# Patient Record
Sex: Female | Born: 1965 | Race: White | Hispanic: No | Marital: Married | Smoking: Never smoker
Health system: Southern US, Community
[De-identification: ages and names within clinical notes are randomized; demographics above are authoritative.]

## PROBLEM LIST (undated history)

## (undated) DIAGNOSIS — D72819 Decreased white blood cell count, unspecified: Secondary | ICD-10-CM

## (undated) DIAGNOSIS — I10 Essential (primary) hypertension: Secondary | ICD-10-CM

## (undated) DIAGNOSIS — Z8673 Personal history of transient ischemic attack (TIA), and cerebral infarction without residual deficits: Secondary | ICD-10-CM

## (undated) DIAGNOSIS — J852 Abscess of lung without pneumonia: Secondary | ICD-10-CM

## (undated) DIAGNOSIS — K5790 Diverticulosis of intestine, part unspecified, without perforation or abscess without bleeding: Secondary | ICD-10-CM

## (undated) DIAGNOSIS — M069 Rheumatoid arthritis, unspecified: Secondary | ICD-10-CM

## (undated) DIAGNOSIS — F419 Anxiety disorder, unspecified: Secondary | ICD-10-CM

## (undated) DIAGNOSIS — K056 Periodontal disease, unspecified: Secondary | ICD-10-CM

## (undated) DIAGNOSIS — C539 Malignant neoplasm of cervix uteri, unspecified: Secondary | ICD-10-CM

## (undated) HISTORY — PX: PARATHYROIDECTOMY: SHX19

## (undated) HISTORY — DX: Malignant neoplasm of cervix uteri, unspecified: C53.9

## (undated) HISTORY — DX: Diverticulosis of intestine, part unspecified, without perforation or abscess without bleeding: K57.90

## (undated) HISTORY — DX: Decreased white blood cell count, unspecified: D72.819

## (undated) HISTORY — DX: Essential (primary) hypertension: I10

## (undated) HISTORY — PX: TOTAL ABDOMINAL HYSTERECTOMY: SHX209

## (undated) HISTORY — PX: HAND SURGERY: SHX662

## (undated) HISTORY — DX: Rheumatoid arthritis, unspecified: M06.9

## (undated) HISTORY — DX: Anxiety disorder, unspecified: F41.9

## (undated) HISTORY — DX: Periodontal disease, unspecified: K05.6

## (undated) HISTORY — PX: ABDOMINAL HYSTERECTOMY: SHX81

## (undated) HISTORY — DX: Personal history of transient ischemic attack (TIA), and cerebral infarction without residual deficits: Z86.73

## (undated) HISTORY — DX: Abscess of lung without pneumonia: J85.2

## (undated) HISTORY — PX: CHOLECYSTECTOMY: SHX55

---

## 2007-12-21 ENCOUNTER — Encounter: Payer: Self-pay | Admitting: Emergency Medicine

## 2007-12-21 ENCOUNTER — Inpatient Hospital Stay (HOSPITAL_COMMUNITY): Admission: EM | Admit: 2007-12-21 | Discharge: 2007-12-23 | Payer: Self-pay | Admitting: Pediatrics

## 2007-12-22 ENCOUNTER — Ambulatory Visit: Payer: Self-pay | Admitting: Surgery

## 2007-12-22 ENCOUNTER — Encounter (INDEPENDENT_AMBULATORY_CARE_PROVIDER_SITE_OTHER): Payer: Self-pay | Admitting: Pediatrics

## 2007-12-22 ENCOUNTER — Encounter (INDEPENDENT_AMBULATORY_CARE_PROVIDER_SITE_OTHER): Payer: Self-pay | Admitting: Neurology

## 2007-12-23 ENCOUNTER — Encounter (INDEPENDENT_AMBULATORY_CARE_PROVIDER_SITE_OTHER): Payer: Self-pay | Admitting: Neurology

## 2008-08-20 ENCOUNTER — Emergency Department (HOSPITAL_COMMUNITY): Admission: EM | Admit: 2008-08-20 | Discharge: 2008-08-20 | Payer: Self-pay | Admitting: Family Medicine

## 2008-08-25 ENCOUNTER — Inpatient Hospital Stay (HOSPITAL_COMMUNITY): Admission: EM | Admit: 2008-08-25 | Discharge: 2008-08-28 | Payer: Self-pay | Admitting: Emergency Medicine

## 2008-08-25 ENCOUNTER — Ambulatory Visit: Payer: Self-pay | Admitting: Internal Medicine

## 2008-08-26 ENCOUNTER — Encounter (INDEPENDENT_AMBULATORY_CARE_PROVIDER_SITE_OTHER): Payer: Self-pay | Admitting: Internal Medicine

## 2008-08-26 ENCOUNTER — Ambulatory Visit: Payer: Self-pay | Admitting: Internal Medicine

## 2008-08-27 ENCOUNTER — Encounter: Payer: Self-pay | Admitting: Internal Medicine

## 2008-09-13 LAB — CBC WITH DIFFERENTIAL/PLATELET
BASO%: 1.1 % (ref 0.0–2.0)
EOS%: 4.5 % (ref 0.0–7.0)
HCT: 35.3 % (ref 34.8–46.6)
MCH: 30.3 pg (ref 26.0–34.0)
MCHC: 35.8 g/dL (ref 32.0–36.0)
MONO#: 0.8 10*3/uL (ref 0.1–0.9)
RBC: 4.17 10*6/uL (ref 3.70–5.32)
RDW: 15.2 % — ABNORMAL HIGH (ref 11.3–14.5)
WBC: 4.1 10*3/uL (ref 3.9–10.0)
lymph#: 2.5 10*3/uL (ref 0.9–3.3)

## 2008-09-13 LAB — LACTATE DEHYDROGENASE: LDH: 131 U/L (ref 94–250)

## 2008-09-24 ENCOUNTER — Ambulatory Visit: Payer: Self-pay | Admitting: Family Medicine

## 2008-09-29 ENCOUNTER — Ambulatory Visit: Payer: Self-pay | Admitting: *Deleted

## 2008-11-15 ENCOUNTER — Ambulatory Visit: Payer: Self-pay | Admitting: Internal Medicine

## 2008-11-15 ENCOUNTER — Encounter (INDEPENDENT_AMBULATORY_CARE_PROVIDER_SITE_OTHER): Payer: Self-pay | Admitting: Family Medicine

## 2008-11-15 LAB — CONVERTED CEMR LAB
ALT: 13 units/L (ref 0–35)
AST: 21 units/L (ref 0–37)
Albumin: 3.7 g/dL (ref 3.5–5.2)
Alkaline Phosphatase: 70 units/L (ref 39–117)
BUN: 12 mg/dL (ref 6–23)
Basophils Absolute: 0 10*3/uL (ref 0.0–0.1)
Basophils Relative: 1 % (ref 0–1)
CO2: 23 meq/L (ref 19–32)
Calcium: 8.6 mg/dL (ref 8.4–10.5)
Chloride: 98 meq/L (ref 96–112)
Creatinine, Ser: 0.97 mg/dL (ref 0.40–1.20)
Eosinophils Absolute: 0.2 10*3/uL (ref 0.0–0.7)
Eosinophils Relative: 8 % — ABNORMAL HIGH (ref 0–5)
Glucose, Bld: 97 mg/dL (ref 70–99)
HCT: 35.4 % — ABNORMAL LOW (ref 36.0–46.0)
Hemoglobin: 12.4 g/dL (ref 12.0–15.0)
Lymphocytes Relative: 59 % — ABNORMAL HIGH (ref 12–46)
Lymphs Abs: 1.4 10*3/uL (ref 0.7–4.0)
MCHC: 35 g/dL (ref 30.0–36.0)
MCV: 86.6 fL (ref 78.0–100.0)
Monocytes Absolute: 0.5 10*3/uL (ref 0.1–1.0)
Monocytes Relative: 22 % — ABNORMAL HIGH (ref 3–12)
Neutro Abs: 0.3 10*3/uL — ABNORMAL LOW (ref 1.7–7.7)
Neutrophils Relative %: 11 % — ABNORMAL LOW (ref 43–77)
Platelets: 118 10*3/uL — ABNORMAL LOW (ref 150–400)
Potassium: 3.4 meq/L — ABNORMAL LOW (ref 3.5–5.3)
RBC: 4.09 M/uL (ref 3.87–5.11)
RDW: 16 % — ABNORMAL HIGH (ref 11.5–15.5)
Sodium: 136 meq/L (ref 135–145)
Total Bilirubin: 0.3 mg/dL (ref 0.3–1.2)
Total Protein: 6.8 g/dL (ref 6.0–8.3)
WBC: 2.3 10*3/uL — ABNORMAL LOW (ref 4.0–10.5)

## 2009-01-31 ENCOUNTER — Inpatient Hospital Stay (HOSPITAL_COMMUNITY): Admission: EM | Admit: 2009-01-31 | Discharge: 2009-02-03 | Payer: Self-pay | Admitting: Emergency Medicine

## 2010-05-11 ENCOUNTER — Observation Stay (HOSPITAL_COMMUNITY): Admission: EM | Admit: 2010-05-11 | Discharge: 2010-05-11 | Payer: Self-pay | Admitting: Emergency Medicine

## 2010-05-11 ENCOUNTER — Encounter (INDEPENDENT_AMBULATORY_CARE_PROVIDER_SITE_OTHER): Payer: Self-pay | Admitting: Internal Medicine

## 2010-05-11 ENCOUNTER — Ambulatory Visit: Payer: Self-pay | Admitting: Cardiology

## 2010-05-30 ENCOUNTER — Encounter: Admission: RE | Admit: 2010-05-30 | Discharge: 2010-05-30 | Payer: Self-pay | Admitting: Gastroenterology

## 2010-06-16 ENCOUNTER — Encounter: Payer: Self-pay | Admitting: Critical Care Medicine

## 2010-06-20 ENCOUNTER — Encounter: Payer: Self-pay | Admitting: Critical Care Medicine

## 2010-06-21 DIAGNOSIS — F411 Generalized anxiety disorder: Secondary | ICD-10-CM | POA: Insufficient documentation

## 2010-06-21 DIAGNOSIS — M069 Rheumatoid arthritis, unspecified: Secondary | ICD-10-CM | POA: Insufficient documentation

## 2010-06-21 DIAGNOSIS — D72819 Decreased white blood cell count, unspecified: Secondary | ICD-10-CM | POA: Insufficient documentation

## 2010-06-21 DIAGNOSIS — I10 Essential (primary) hypertension: Secondary | ICD-10-CM | POA: Insufficient documentation

## 2010-06-22 ENCOUNTER — Encounter: Payer: Self-pay | Admitting: Critical Care Medicine

## 2010-06-22 ENCOUNTER — Ambulatory Visit: Payer: Self-pay | Admitting: Critical Care Medicine

## 2010-06-22 DIAGNOSIS — Z8679 Personal history of other diseases of the circulatory system: Secondary | ICD-10-CM | POA: Insufficient documentation

## 2010-06-22 DIAGNOSIS — J852 Abscess of lung without pneumonia: Secondary | ICD-10-CM | POA: Insufficient documentation

## 2010-06-23 ENCOUNTER — Telehealth: Payer: Self-pay | Admitting: Critical Care Medicine

## 2010-06-26 ENCOUNTER — Telehealth (INDEPENDENT_AMBULATORY_CARE_PROVIDER_SITE_OTHER): Payer: Self-pay | Admitting: *Deleted

## 2010-06-27 ENCOUNTER — Ambulatory Visit: Payer: Self-pay | Admitting: Critical Care Medicine

## 2010-06-27 ENCOUNTER — Ambulatory Visit: Admission: RE | Admit: 2010-06-27 | Discharge: 2010-06-27 | Payer: Self-pay | Admitting: Critical Care Medicine

## 2010-06-28 ENCOUNTER — Telehealth: Payer: Self-pay | Admitting: Critical Care Medicine

## 2010-07-03 ENCOUNTER — Telehealth: Payer: Self-pay | Admitting: Critical Care Medicine

## 2010-07-06 ENCOUNTER — Ambulatory Visit: Payer: Self-pay | Admitting: Critical Care Medicine

## 2010-07-06 ENCOUNTER — Ambulatory Visit (HOSPITAL_BASED_OUTPATIENT_CLINIC_OR_DEPARTMENT_OTHER)
Admission: RE | Admit: 2010-07-06 | Discharge: 2010-07-06 | Payer: Self-pay | Source: Home / Self Care | Admitting: Critical Care Medicine

## 2010-07-06 DIAGNOSIS — K0520 Aggressive periodontitis, unspecified: Secondary | ICD-10-CM

## 2010-07-06 DIAGNOSIS — K029 Dental caries, unspecified: Secondary | ICD-10-CM | POA: Insufficient documentation

## 2010-07-10 ENCOUNTER — Encounter: Payer: Self-pay | Admitting: Critical Care Medicine

## 2010-08-17 ENCOUNTER — Encounter: Payer: Self-pay | Admitting: Critical Care Medicine

## 2010-08-17 ENCOUNTER — Ambulatory Visit: Admit: 2010-08-17 | Payer: Self-pay | Admitting: Critical Care Medicine

## 2010-09-05 NOTE — Assessment & Plan Note (Signed)
Summary: Pulmonary OV   Copy to:  Zenovia Jordan, MD Primary Provider/Referring Provider:  Dr. Juluis Rainier, [facility]  CC:  2 wk follow up with CXR.  Breathing and cough have improved.  Prod cough with small amount of clear to yellow mucus..  History of Present Illness: Pulmonary Consultation   45 yo WF referred for evaluation of RUL cavitary lung lesion  This pt has a 3cm nodule LUL cavitarylesion on recent CXR  not present 10/11.  THis is now  present on 06/16/10 film.  The pt's symptoms include:  chest pain, wheezing, hoarse, diff time swallowing, high fevers 101-102,  The pt has had hectic fevers for 3months. The pt notes temp spike to 102.   There is mucus productive :  clear to green.  There has been 3 months of  cough sometimes is dry and productive. There is severe chest pain as well The pt Dx RA for 74yrs. Rx: mtx, arava, gold ., plaq, celebrex, embrel With Mtx : lfts up ,  ARAVA did not help.  The gold did not help The embrel given >1.56yr.  MTX rx: 31yrs.  RHeum MD was [facility] in [place].   FOr RA: Recently now no prednisone and IBup. The pt has been on  prednisone on and off for 18yrs. The pt is now on 51m/d pred now.  The pt was on 10mg /d for one month and now just bumped to 15/d This pt has been Dx with prior infections in chest , These episodes are  more freq  in past year.  There is min smoke exposure, no passive smoke The pt has been dx bronchitis.  There has not been  dx of pna in past The pt does wheeze on L side .  No TB exposure  The pt has hadrecent ABX: zpak. 10/11.  No abx in 3-4 weeks. July 06, 2010 12:04 PM This pt is improved. There is less cough and dyspnea.  There is no chest pain. There is no fever. Pt now relates she has had problems with severe dental caries and abscesses in gum. Pt underwent Fob.  No endobronch lesions seen   Current Medications (verified): 1)  Coreg 6.25 Mg Tabs (Carvedilol) .... Take 1 Tablet By Mouth Two Times A Day 2)   Prednisone 10 Mg Tabs (Prednisone) .... 15mg  Once Daily 3)  Klor-Con M10 10 Meq Cr-Tabs (Potassium Chloride Crys Cr) .... Take 1 Tablet By Mouth Three Times A Day 4)  Aldactone 25 Mg Tabs (Spironolactone) .... Take 1 Tablet By Mouth Once A Day 5)  Norvasc 5 Mg Tabs (Amlodipine Besylate) .... Take 1 Tablet By Mouth Once A Day 6)  Ambien 5 Mg Tabs (Zolpidem Tartrate) .... Take 1 Tab By Mouth At Bedtime 7)  Trazodone Hcl 50 Mg Tabs (Trazodone Hcl) .... Take 1 Tab By Mouth At Bedtime 8)  Xanax 1 Mg Tabs (Alprazolam) .... Take 1 Tab By Mouth At Bedtime 9)  Aspirin 325 Mg  Tabs (Aspirin) .... Take 1 Tablet By Mouth Once A Day 10)  Cleocin 300 Mg Caps (Clindamycin Hcl) .... Take 1 Capsule By Mouth Three Times A Day  Allergies (verified): 1)  ! Morphine  Past History:  Past medical, surgical, family and social histories (including risk factors) reviewed, and no changes noted (except as noted below).  Past Medical History: TRANSIENT ISCHEMIC ATTACKS, HX OF (ICD-V12.50)    -June 2011,  May 2008, Jan 2009    -Due to HTN LEUKOPENIA, CHRONIC (ICD-288.50) ANXIETY (ICD-300.00)  ARTHRITIS, RHEUMATOID (ICD-714.0) HYPERTENSION (ICD-401.9) Cervical CA    -Hysterectomy 2003 Chronic periodontal disease, dental caries    -s/p fall, pins and plates in chin  Diverticulosis  Past Surgical History: Reviewed history from 06/22/2010 and no changes required. Cholecystectomy hysterectomy hand surgery parathyroidectomy  Family History: Reviewed history from 06/22/2010 and no changes required. Factor 5-mother Family History Diabetes-father Family History Hypertension-parents Family History Asthma-brothers, daughter  Social History: Reviewed history from 06/22/2010 and no changes required. Marital Status: Married, lives with spouse Daryel Gerald Children: Yes, 2 daughters Occupation: Herbalist Social smoker stopped x 3 years ago  Review of Systems  The patient denies shortness of breath with  activity, shortness of breath at rest, productive cough, non-productive cough, coughing up blood, chest pain, irregular heartbeats, acid heartburn, indigestion, loss of appetite, weight change, abdominal pain, difficulty swallowing, sore throat, tooth/dental problems, headaches, nasal congestion/difficulty breathing through nose, sneezing, itching, ear ache, anxiety, depression, hand/feet swelling, joint stiffness or pain, rash, change in color of mucus, and fever.         above states no dental problems which is incorrect, sevre dental disease is noted  Vital Signs:  Patient profile:   45 year old female Height:      64 inches Weight:      109.31 pounds BMI:     18.83 O2 Sat:      100 % on Room air Temp:     98.1 degrees F oral Pulse rate:   89 / minute BP sitting:   100 / 60  (left arm) Cuff size:   regular  Vitals Entered By: Gweneth Dimitri RN (July 06, 2010 11:57 AM)  O2 Flow:  Room air CC: 2 wk follow up with CXR.  Breathing and cough have improved.  Prod cough with small amount of clear to yellow mucus. Comments Medications reviewed with patient Daytime contact number verified with patient. Gweneth Dimitri RN  July 06, 2010 11:58 AM    Physical Exam  Additional Exam:  Gen: Pleasant, well-nourished, in no distress,  normal affect ENT: severe dental caries/gum abscesses  Neck: No JVD, no TMG, no carotid bruits Lungs: No use of accessory muscles, no dullness to percussion, clearer  Cardiovascular: RRR, heart sounds normal, no murmur or gallops, no peripheral edema Abdomen: soft and NT, no HSM,  BS normal Musculoskeletal: No deformities, no cyanosis or clubbing Neuro: alert, non focal Skin: Warm, no lesions or rashes    Impression & Recommendations:  Problem # 1:  ABSCESS OF LUNG (ICD-513.0) Assessment Improved  Orders: Est. 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 Patient Level IV (60454)  Medications Added to Medication List This Visit: 1)  Cleocin 300 Mg Caps (Clindamycin hcl) .... Take 1 capsule by mouth three times a day 2)  Cleocin 300 Mg Caps (Clindamycin hcl) .... Take 1 capsule by mouth three times a day  Complete Medication List: 1)  Coreg 6.25 Mg Tabs (Carvedilol) .... Take 1 tablet by mouth two times a day 2)  Prednisone 10 Mg Tabs (Prednisone) .... 15mg  once daily 3)  Klor-con M10 10 Meq Cr-tabs (Potassium chloride crys cr) .... Take 1 tablet by mouth three times a day 4)  Aldactone 25 Mg Tabs (Spironolactone) .... Take 1 tablet by mouth once a day 5)  Norvasc 5 Mg Tabs (Amlodipine besylate) .... Take 1 tablet by mouth once a day 6)  Ambien 5 Mg Tabs (Zolpidem tartrate) .... Take 1 tab by mouth at bedtime 7)  Trazodone Hcl 50 Mg Tabs (Trazodone hcl) .... Take 1 tab by mouth at bedtime 8)  Xanax 1 Mg Tabs (Alprazolam) .... Take 1 tab by mouth at bedtime 9)  Aspirin 325 Mg Tabs (Aspirin) .... Take 1 tablet by mouth once a day 10)  Cleocin 300 Mg Caps (Clindamycin hcl) .... Take 1 capsule by mouth three times a day  Patient Instructions: 1)  7 more days of clindamycin sent to your pharmacy 2)  Consider taking one sennakot S every day and one probiotic capsule daily 3)  A letter of medical necessity will be prepared to get teeth extracted 4)  No other medication changes 5)  Return 6 weeks Prescriptions: CLEOCIN 300 MG CAPS (CLINDAMYCIN HCL) Take 1 capsule by mouth three times a day  #21 x 0   Entered and Authorized by:   Storm Frisk MD   Signed by:   Storm Frisk MD on 07/06/2010   Method used:   [facility] to        [facility] [phone]* (retail)       [address] [place], [place]  [address]       Ph: [phone] or [phone]       Fax: [phone]   RxID:    [id number]     Appended Document: Pulmonary OV fax angela Neomia Dear

## 2010-09-05 NOTE — Progress Notes (Signed)
Summary: letter for insurance  Phone Note Call from Patient Call back at [phone]   Caller: Spouse//harley Call For: wright Summary of Call: Wants to talk to nurse in ref to infection in pt's lungs. Initial call taken by: Darletta Moll,  July 03, 2010 3:43 PM  Follow-up for Phone Call        Spoke with pt husband and he wanted to ask Dr. Delford Field if they need a letter for insurance company to cover dental work woud he be willing ot write a letter stating that he feels the infections in pt lung and intestines could be due to infections in pt teeth. Spouse states he does not need the letter at this time, but just wants to know if Dr. Delford Field will be willing to write one if needed.  Pt has appt on 07-06-10. Please advsie. Carron Curie CMA  July 03, 2010 4:08 PM   Additional Follow-up for Phone Call Additional follow up Details #1::        I would be willing to do so, I would need some copy of the pt's dental records to put in our EMR to be complete. Additional Follow-up by: Storm Frisk MD,  July 03, 2010 4:19 PM    Additional Follow-up for Phone Call Additional follow up Details #2::    pt spouse advsied and he will get dental records. He will let us know if he needs the letter.  Carron Curie CMA  July 03, 2010 4:21 PM

## 2010-09-05 NOTE — Letter (Signed)
Summary: Obie Dredge MD  Obie Dredge MD   Imported By: Lester [place] 06/30/2010 12:42:08  _____________________________________________________________________  External Attachment:    Type:   Image     Comment:   External Document

## 2010-09-05 NOTE — Progress Notes (Signed)
Summary: results  Phone Note Call from Patient Call back at [phone]   Caller: Spouse harley Call For: wright Reason for Call: Referral Summary of Call: calling about biopsy results Initial call taken by: Rickard Patience,  June 28, 2010 10:46 AM  Follow-up for Phone Call        Spoke with pt husband and he wanted to get Dr. Florene Route opinion on whether he thinks the pt dental infections have anything to do with the infection in her lungs. He states she has several infected teeth that they are in the process of getting taken care of, but all of her other problems started after the teeth became infected. He wants to know if there is any connection between the dental infections and her lund abcess. Pelase advise.Carron Curie CMA  June 28, 2010 10:58 AM   Additional Follow-up for Phone Call Additional follow up Details #1::        called back, pt with significant dental issues, she will get cleocin 300mg  three times a day x 7days further  all c/s neg to date Additional Follow-up by: Storm Frisk MD,  June 28, 2010 1:22 PM    New/Updated Medications: CLINDAMYCIN HCL 300 MG CAPS (CLINDAMYCIN HCL) one by mouth three times a day Prescriptions: CLINDAMYCIN HCL 300 MG CAPS (CLINDAMYCIN HCL) one by mouth three times a day  #21 x 0   Entered and Authorized by:   Storm Frisk MD   Signed by:   Storm Frisk MD on 06/28/2010   Method used:   Electronically to        [facility] [phone]* (retail)       [address] [place], [place]  [address]       Ph: [phone] or [phone]       Fax: [phone]   RxID:   [id number]

## 2010-09-05 NOTE — Letter (Signed)
Summary: Valerie Koch IM at [facility] IM at [facility]   Imported By: Lennie Odor 07/14/2010 15:32:30  _____________________________________________________________________  External Attachment:    Type:   Image     Comment:   External Document

## 2010-09-05 NOTE — Miscellaneous (Signed)
Summary: CXR   Clinical Lists Changes  Orders: Added new Test order of T-2 View CXR (71020TC) - Signed 

## 2010-09-05 NOTE — Assessment & Plan Note (Signed)
Summary: Pulmonary Consultation   Copy to:  Zenovia Jordan, MD Primary Lanny Donoso/Referring Tsuneo Faison:  Dr. Juluis Rainier, [facility]  CC:  Pulmonary consult.  History of Present Illness: Pulmonary Consultation   45 yo WF referred for evaluation of RUL cavitary lung lesion  This pt has a 3cm nodule LUL cavitarylesion on recent CXR  not present 10/11.  THis is now  present on 06/16/10 film.  The pt's symptoms include:  chest pain, wheezing, hoarse, diff time swallowing, high fevers 101-102,  The pt has had hectic fevers for 3months. The pt notes temp spike to 102.   There is mucus productive :  clear to green.  There has been 3 months of  cough sometimes is dry and productive. There is severe chest pain as well The pt Dx RA for 40yrs. Rx: mtx, arava, gold ., plaq, celebrex, embrel With Mtx : lfts up ,  ARAVA did not help.  The gold did not help The embrel given >1.94yr.  MTX rx: 60yrs.  RHeum MD was [facility] in [place].   FOr RA: Recently now no prednisone and IBup. The pt has been on  prednisone on and off for 37yrs. The pt is now on 25m/d pred now.  The pt was on 10mg /d for one month and now just bumped to 15/d This pt has been Dx with prior infections in chest , These episodes are  more freq  in past year.  There is min smoke exposure, no passive smoke The pt has been dx bronchitis.  There has not been  dx of pna in past The pt does wheeze on L side .  No TB exposure  The pt has hadrecent ABX: zpak. 10/11.  No abx in 3-4 weeks.   Preventive Screening-Counseling & Management  Alcohol-Tobacco     Smoking Status: never  Current Medications (verified): 1)  Coreg 6.25 Mg Tabs (Carvedilol) .... Take 1 Tablet By Mouth Two Times A Day 2)  Prednisone 10 Mg Tabs (Prednisone) .... 15mg  Once Daily 3)  Klor-Con M10 10 Meq Cr-Tabs (Potassium Chloride Crys Cr) .... Take 1 Tablet By Mouth Three Times A Day 4)  Aldactone 25 Mg Tabs (Spironolactone) .... Take 1 Tablet By Mouth Once A Day 5)   Norvasc 5 Mg Tabs (Amlodipine Besylate) .... Take 1 Tablet By Mouth Once A Day 6)  Ambien 5 Mg Tabs (Zolpidem Tartrate) .... Take 1 Tab By Mouth At Bedtime 7)  Trazodone Hcl 50 Mg Tabs (Trazodone Hcl) .... Take 1 Tab By Mouth At Bedtime 8)  Xanax 1 Mg Tabs (Alprazolam) .... Take 1 Tab By Mouth At Bedtime 9)  Aspirin 325 Mg  Tabs (Aspirin) .... Take 1 Tablet By Mouth Once A Day  Allergies (verified): 1)  ! Morphine  Past History:  Past medical, surgical, family and social histories (including risk factors) reviewed, and no changes noted (except as noted below).  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 Factor 5-mother Family History Diabetes-father Family History Hypertension-parents Family History Asthma-brothers, daughter  Social History: Reviewed history and no changes required. 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  The patient denies coughing up blood, nasal congestion/difficulty breathing through nose, sneezing, itching, ear ache, and rash.          Weight is down 125>>>105  Vital Signs:  Patient profile:   45 year old female Height:      64 inches Weight:      105 pounds BMI:     18.09 O2 Sat:      100 % on Room air Temp:     98.3 degrees F oral Pulse rate:   75 / minute BP sitting:   120 / 88  (left arm) Cuff size:   regular  Vitals Entered By: Zackery Barefoot CMA (June 22, 2010 2:19 PM)  O2 Flow:  Room air CC: Pulmonary consult Comments Medications reviewed with patient Verified contact number and pharmacy with patient Zackery Barefoot CMA  June 22, 2010 2:19 PM    Physical Exam  Additional Exam:  Gen: Pleasant, well-nourished, in no distress,  normal affect ENT: No lesions,  mouth clear,  oropharynx clear, no postnasal drip Neck: No JVD, no TMG, no carotid bruits Lungs: No use of accessory muscles, no dullness to percussion, few rhonchi and exp wheeze RUL Cardiovascular: RRR, heart sounds normal, no murmur or gallops, no peripheral edema Abdomen: soft and NT, no HSM,  BS normal Musculoskeletal: No deformities, no cyanosis or clubbing Neuro: alert, non focal Skin: Warm, no lesions or rashes    CXR  Procedure date:  06/16/2010  Findings:      Rul cavitation   CT of Chest  Procedure date:  05/11/2010  Findings:      stable L adrenal nodule, no PE,  no acute findings  Impression & Recommendations:  Problem # 1:  ABSCESS OF LUNG (ICD-513.0) Assessment Deteriorated RUL lung abscess ? etiology plan FOB 10days avelox  Orders: New Patient Level V (27253) Spirometry w/Graph (94010)  Medications Added to Medication List This Visit: 1)  Coreg 6.25 Mg Tabs (Carvedilol) .... Take 1 tablet by mouth two times a day 2)  Prednisone 10 Mg Tabs (Prednisone) .... 15mg  once daily 3)  Klor-con M10 10 Meq Cr-tabs (Potassium chloride crys cr) .... Take 1 tablet by mouth three times a day 4)  Aldactone 25 Mg Tabs (Spironolactone) .... Take 1 tablet by mouth once a day 5)  Norvasc 5 Mg Tabs  (Amlodipine besylate) .... Take 1 tablet by mouth once a day 6)  Ambien 5 Mg Tabs (Zolpidem tartrate) .... Take 1 tab by mouth at bedtime 7)  Trazodone Hcl 50 Mg Tabs (Trazodone hcl) .... Take 1 tab by mouth at bedtime 8)  Xanax 1 Mg Tabs (Alprazolam) .... Take 1 tab by mouth at bedtime 9)  Aspirin 325 Mg Tabs (Aspirin) .... Take 1 tablet by mouth once a day 10)  Avelox 400 Mg Tabs (Moxifloxacin hcl) .... By mouth daily  Complete Medication List: 1)  Coreg 6.25 Mg Tabs (Carvedilol) .... Take 1 tablet by mouth two times a day 2)  Prednisone 10 Mg Tabs (Prednisone) .... 15mg  once daily 3)  Klor-con M10 10 Meq Cr-tabs (Potassium chloride crys cr) .Marland KitchenMarland KitchenMarland Kitchen  Take 1 tablet by mouth three times a day 4)  Aldactone 25 Mg Tabs (Spironolactone) .... Take 1 tablet by mouth once a day 5)  Norvasc 5 Mg Tabs (Amlodipine besylate) .... Take 1 tablet by mouth once a day 6)  Ambien 5 Mg Tabs (Zolpidem tartrate) .... Take 1 tab by mouth at bedtime 7)  Trazodone Hcl 50 Mg Tabs (Trazodone hcl) .... Take 1 tab by mouth at bedtime 8)  Xanax 1 Mg Tabs (Alprazolam) .... Take 1 tab by mouth at bedtime 9)  Aspirin 325 Mg Tabs (Aspirin) .... Take 1 tablet by mouth once a day 10)  Avelox 400 Mg Tabs (Moxifloxacin hcl) .... By mouth daily  Patient Instructions: 1)  Avelox one daily for 10days 2)  I willl call you tomorrow regarding your chest xray and regarding need for bronchoscopy next week. 3)  No other medication changes 4)  Return [place] office in two weeks  Prescriptions: AVELOX 400 MG  TABS (MOXIFLOXACIN HCL) By mouth daily  #7 x 0   Entered and Authorized by:   Storm Frisk MD   Signed by:   Storm Frisk MD on 06/22/2010   Method used:   [facility] to        [facility] [phone]* (retail)       [address] [place], [place]  [address]       Ph: [phone] or [phone]       Fax: [phone]   RxID:   [id number]    Immunization History:  Influenza Immunization History:     Influenza:  historical (05/08/2010)

## 2010-09-05 NOTE — Miscellaneous (Signed)
Summary: Letter of Medical Necessity  Clinical Lists Changes To whom it may concern:   Ms Valerie Koch is a patient of mine who has a severe resolving lung abscess.  The origin of this abscess is from severe periodontal disease and dental caries such that she is aspirating infected material into her lungs.  She is severely immunosuppressed with rheumatoid arthritis which has exacerbated her condition.  Please consider honoring her request to cover expenses associated with the oral maxillosurgical approach to her severe dental disease which is life threatening to her very existence.   Please take this under advisement. 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

## 2010-09-05 NOTE — Progress Notes (Signed)
Summary: samples  Phone Note Call from Patient Call back at [phone]   Caller: Spouse harley Call For: wright Summary of Call: pt can't afford avelox would like samples. Initial call taken by: Rickard Patience,  June 26, 2010 11:25 AM  Follow-up for Phone Call        Pt's spouse informed that Avelox asmples were left at front desk for pick up. Abigail Miyamoto RN  June 26, 2010 11:46 AM

## 2010-09-05 NOTE — Progress Notes (Signed)
Summary: FOB scheduled-pt aware  Phone Note Outgoing Call   Reason for Call: Confirm/change Appt Summary of Call: call pt to confirm she has FOB scheduled at 730am nov 22 at [facility].  arrive 630am  NPO after midnight the night before. i called and LMTCB but no answer  Initial call taken by: Storm Frisk MD,  June 23, 2010 2:14 PM  Follow-up for Phone Call        [facility] Vernie Murders  June 23, 2010 2:23 PM  I called and spoke with pt and verified FOB appt date and time and also NPO after midnight the night before.  Pt verbalized understanding and denied any questions. Follow-up by: Vernie Murders,  June 23, 2010 2:54 PM

## 2010-09-07 NOTE — Miscellaneous (Signed)
Summary: cxr order  Clinical Lists Changes  Orders: Added new Test order of T-2 View CXR (71020TC) - Signed 

## 2010-09-11 ENCOUNTER — Encounter: Payer: Self-pay | Admitting: Critical Care Medicine

## 2010-09-21 NOTE — Letter (Signed)
Summary: Zenovia Jordan MD/[facility] MD/[facility]   Imported By: Lester [place] 09/15/2010 13:31:03  _____________________________________________________________________  External Attachment:    Type:   Image     Comment:   External Document

## 2010-10-17 LAB — FUNGUS CULTURE W SMEAR
Fungal Smear: NONE SEEN
Fungal Smear: NONE SEEN

## 2010-10-17 LAB — CULTURE, RESPIRATORY W GRAM STAIN
Culture: NO GROWTH
Gram Stain: NONE SEEN

## 2010-10-17 LAB — LEGIONELLA PROFILE(CULTURE+DFA/SMEAR): Legionella Antigen (DFA): NEGATIVE

## 2010-10-17 LAB — AFB CULTURE WITH SMEAR (NOT AT ARMC): Acid Fast Smear: NONE SEEN

## 2010-10-17 LAB — PNEUMOCYSTIS JIROVECI SMEAR BY DFA: Pneumocystis jiroveci Ag: NOT DETECTED

## 2010-10-19 LAB — CBC
HCT: 32.4 % — ABNORMAL LOW (ref 36.0–46.0)
Hemoglobin: 11.5 g/dL — ABNORMAL LOW (ref 12.0–15.0)
MCH: 29.3 pg (ref 26.0–34.0)
MCHC: 35.5 g/dL (ref 30.0–36.0)
MCV: 81.8 fL (ref 78.0–100.0)
MCV: 82.7 fL (ref 78.0–100.0)
Platelets: 116 10*3/uL — ABNORMAL LOW (ref 150–400)
Platelets: 96 10*3/uL — ABNORMAL LOW (ref 150–400)
RBC: 3.92 MIL/uL (ref 3.87–5.11)
RDW: 13.8 % (ref 11.5–15.5)
RDW: 13.8 % (ref 11.5–15.5)
WBC: 0.8 10*3/uL — CL (ref 4.0–10.5)
WBC: 3.7 10*3/uL — ABNORMAL LOW (ref 4.0–10.5)

## 2010-10-19 LAB — COMPREHENSIVE METABOLIC PANEL
ALT: 12 U/L (ref 0–35)
AST: 21 U/L (ref 0–37)
Albumin: 3.3 g/dL — ABNORMAL LOW (ref 3.5–5.2)
Alkaline Phosphatase: 53 U/L (ref 39–117)
BUN: 17 mg/dL (ref 6–23)
CO2: 26 mEq/L (ref 19–32)
Calcium: 8.7 mg/dL (ref 8.4–10.5)
Chloride: 109 mEq/L (ref 96–112)
Creatinine, Ser: 0.92 mg/dL (ref 0.4–1.2)
GFR calc Af Amer: >60 mL/min (ref >60)
GFR calc non Af Amer: >60 mL/min (ref >60)
Glucose, Bld: 86 mg/dL (ref 70–99)
Potassium: 3.4 mEq/L — ABNORMAL LOW (ref 3.5–5.1)
Sodium: 140 mEq/L (ref 135–145)
Total Bilirubin: 0.7 mg/dL (ref 0.3–1.2)
Total Protein: 6.4 g/dL (ref 6.0–8.3)

## 2010-10-19 LAB — DIFFERENTIAL
Basophils Absolute: 0 10*3/uL (ref 0.0–0.1)
Basophils Relative: 1 % (ref 0–1)
Eosinophils Absolute: 0 10*3/uL (ref 0.0–0.7)
Eosinophils Relative: 1 % (ref 0–5)
Lymphocytes Relative: 79 % — ABNORMAL HIGH (ref 12–46)
Lymphs Abs: 3 10*3/uL (ref 0.7–4.0)
Monocytes Absolute: 0.6 10*3/uL (ref 0.1–1.0)
Monocytes Relative: 16 % — ABNORMAL HIGH (ref 3–12)
Neutro Abs: 0.1 10*3/uL — ABNORMAL LOW (ref 1.7–7.7)
Neutrophils Relative %: 3 % — ABNORMAL LOW (ref 43–77)

## 2010-10-19 LAB — CK TOTAL AND CKMB (NOT AT ARMC)
CK, MB: 0.6 ng/mL (ref 0.3–4.0)
CK, MB: 0.8 ng/mL (ref 0.3–4.0)
Total CK: 39 U/L (ref 7–177)

## 2010-10-19 LAB — POCT CARDIAC MARKERS
CKMB, poc: <1 ng/mL — ABNORMAL LOW (ref 1.0–8.0)
Troponin i, poc: <0.05 ng/mL (ref 0.00–0.09)

## 2010-10-19 LAB — PATHOLOGIST SMEAR REVIEW

## 2010-10-19 LAB — D-DIMER, QUANTITATIVE: D-Dimer, Quant: 2.76 ug/mL-FEU — ABNORMAL HIGH (ref 0.00–0.48)

## 2010-10-19 LAB — HEPARIN LEVEL (UNFRACTIONATED): Heparin Unfractionated: 0.92 IU/mL — ABNORMAL HIGH (ref 0.30–0.70)

## 2010-10-19 LAB — TROPONIN I: Troponin I: 0.02 ng/mL (ref 0.00–0.06)

## 2010-11-12 LAB — BASIC METABOLIC PANEL
BUN: 8 mg/dL (ref 6–23)
CO2: 25 mEq/L (ref 19–32)
GFR calc non Af Amer: >60 mL/min (ref >60)
Glucose, Bld: 91 mg/dL (ref 70–99)
Potassium: 4.4 mEq/L (ref 3.5–5.1)

## 2010-11-12 LAB — CBC
HCT: 28.3 % — ABNORMAL LOW (ref 36.0–46.0)
MCHC: 34.7 g/dL (ref 30.0–36.0)
MCV: 89.7 fL (ref 78.0–100.0)
Platelets: 71 10*3/uL — ABNORMAL LOW (ref 150–400)
RDW: 15.1 % (ref 11.5–15.5)

## 2010-11-13 LAB — BASIC METABOLIC PANEL
BUN: 10 mg/dL (ref 6–23)
BUN: 8 mg/dL (ref 6–23)
CO2: 24 mEq/L (ref 19–32)
Calcium: 8.5 mg/dL (ref 8.4–10.5)
Chloride: 105 mEq/L (ref 96–112)
Creatinine, Ser: 1 mg/dL (ref 0.4–1.2)
GFR calc non Af Amer: >60 mL/min (ref >60)
GFR calc non Af Amer: >60 mL/min (ref >60)
Glucose, Bld: 96 mg/dL (ref 70–99)
Glucose, Bld: 96 mg/dL (ref 70–99)
Potassium: 3 mEq/L — ABNORMAL LOW (ref 3.5–5.1)
Potassium: 5 mEq/L (ref 3.5–5.1)
Sodium: 133 mEq/L — ABNORMAL LOW (ref 135–145)

## 2010-11-13 LAB — DIFFERENTIAL
Basophils Relative: 1 % (ref 0–1)
Eosinophils Relative: 0 % (ref 0–5)
Monocytes Absolute: 0.9 10*3/uL (ref 0.1–1.0)
Monocytes Relative: 21 % — ABNORMAL HIGH (ref 3–12)
Neutrophils Relative %: 55 % (ref 43–77)

## 2010-11-13 LAB — COMPREHENSIVE METABOLIC PANEL
ALT: 12 U/L (ref 0–35)
AST: 21 U/L (ref 0–37)
Calcium: 8.4 mg/dL (ref 8.4–10.5)
Creatinine, Ser: 0.99 mg/dL (ref 0.4–1.2)
GFR calc Af Amer: >60 mL/min (ref >60)
Sodium: 134 mEq/L — ABNORMAL LOW (ref 135–145)
Total Protein: 7.1 g/dL (ref 6.0–8.3)

## 2010-11-13 LAB — CULTURE, BLOOD (ROUTINE X 2): Culture: NO GROWTH

## 2010-11-13 LAB — URINE CULTURE: Colony Count: >=100000

## 2010-11-13 LAB — CBC
HCT: 28 % — ABNORMAL LOW (ref 36.0–46.0)
Hemoglobin: 9.8 g/dL — ABNORMAL LOW (ref 12.0–15.0)
MCHC: 34.6 g/dL (ref 30.0–36.0)
MCHC: 35 g/dL (ref 30.0–36.0)
MCHC: 35 g/dL (ref 30.0–36.0)
MCV: 88.5 fL (ref 78.0–100.0)
MCV: 88.6 fL (ref 78.0–100.0)
Platelets: 69 10*3/uL — ABNORMAL LOW (ref 150–400)
RBC: 3.2 MIL/uL — ABNORMAL LOW (ref 3.87–5.11)
RBC: 3.82 MIL/uL — ABNORMAL LOW (ref 3.87–5.11)
RDW: 14 % (ref 11.5–15.5)
RDW: 14.8 % (ref 11.5–15.5)

## 2010-11-13 LAB — URINALYSIS, ROUTINE W REFLEX MICROSCOPIC
Bilirubin Urine: NEGATIVE
Ketones, ur: NEGATIVE mg/dL
Nitrite: POSITIVE — AB
Protein, ur: 100 mg/dL — AB
Specific Gravity, Urine: 1.013 (ref 1.005–1.030)
Urobilinogen, UA: 0.2 mg/dL (ref 0.0–1.0)
pH: 6 (ref 5.0–8.0)

## 2010-11-13 LAB — URINE MICROSCOPIC-ADD ON

## 2010-11-13 LAB — MAGNESIUM: Magnesium: 2.1 mg/dL (ref 1.5–2.5)

## 2010-11-20 LAB — DIFFERENTIAL
Band Neutrophils: 0 % (ref 0–10)
Blasts: 0 %
Eosinophils Absolute: 0.3 10*3/uL (ref 0.0–0.7)
Eosinophils Relative: 6 % — ABNORMAL HIGH (ref 0–5)
Eosinophils Relative: 7 % — ABNORMAL HIGH (ref 0–5)
Lymphocytes Relative: 82 % — ABNORMAL HIGH (ref 12–46)
Lymphs Abs: 3.2 10*3/uL (ref 0.7–4.0)
Lymphs Abs: 3.6 10*3/uL (ref 0.7–4.0)
Metamyelocytes Relative: 0 %
Monocytes Absolute: 0.6 10*3/uL (ref 0.1–1.0)
Monocytes Absolute: 1 10*3/uL (ref 0.1–1.0)
Monocytes Relative: 14 % — ABNORMAL HIGH (ref 3–12)
Monocytes Relative: 20 % — ABNORMAL HIGH (ref 3–12)
Neutro Abs: 0.3 10*3/uL — ABNORMAL LOW (ref 1.7–7.7)
Neutro Abs: 0.4 10*3/uL — ABNORMAL LOW (ref 1.7–7.7)
Neutrophils Relative %: 9 % — ABNORMAL LOW (ref 43–77)
Promyelocytes Absolute: 0 %
nRBC: 0 /100 WBC
nRBC: 0 /100 WBC

## 2010-11-20 LAB — URINALYSIS, ROUTINE W REFLEX MICROSCOPIC
Glucose, UA: NEGATIVE mg/dL
Nitrite: NEGATIVE
Protein, ur: NEGATIVE mg/dL
Urobilinogen, UA: 0.2 mg/dL (ref 0.0–1.0)

## 2010-11-20 LAB — POCT I-STAT, CHEM 8
BUN: 17 mg/dL (ref 6–23)
HCT: 33 % — ABNORMAL LOW (ref 36.0–46.0)
Hemoglobin: 11.2 g/dL — ABNORMAL LOW (ref 12.0–15.0)
Sodium: 144 mEq/L (ref 135–145)
TCO2: 30 mmol/L (ref 0–100)

## 2010-11-20 LAB — BASIC METABOLIC PANEL
BUN: 16 mg/dL (ref 6–23)
CO2: 25 mEq/L (ref 19–32)
Calcium: 9.2 mg/dL (ref 8.4–10.5)
Calcium: 9.4 mg/dL (ref 8.4–10.5)
Creatinine, Ser: 0.82 mg/dL (ref 0.4–1.2)
GFR calc Af Amer: >60 mL/min (ref >60)
GFR calc Af Amer: >60 mL/min (ref >60)
GFR calc Af Amer: >60 mL/min (ref >60)
GFR calc non Af Amer: >60 mL/min (ref >60)
GFR calc non Af Amer: >60 mL/min (ref >60)
Glucose, Bld: 94 mg/dL (ref 70–99)
Potassium: 3.3 mEq/L — ABNORMAL LOW (ref 3.5–5.1)
Sodium: 141 mEq/L (ref 135–145)
Sodium: 143 mEq/L (ref 135–145)

## 2010-11-20 LAB — CBC
HCT: 34 % — ABNORMAL LOW (ref 36.0–46.0)
HCT: 36.1 % (ref 36.0–46.0)
Hemoglobin: 12.5 g/dL (ref 12.0–15.0)
MCHC: 34.8 g/dL (ref 30.0–36.0)
MCV: 85.6 fL (ref 78.0–100.0)
Platelets: 138 10*3/uL — ABNORMAL LOW (ref 150–400)
Platelets: 141 10*3/uL — ABNORMAL LOW (ref 150–400)
RBC: 3.98 MIL/uL (ref 3.87–5.11)
RBC: 4.12 MIL/uL (ref 3.87–5.11)
RDW: 15.2 % (ref 11.5–15.5)
WBC: 4 10*3/uL (ref 4.0–10.5)
WBC: 4.8 10*3/uL (ref 4.0–10.5)

## 2010-11-20 LAB — POCT CARDIAC MARKERS
CKMB, poc: <1 ng/mL — ABNORMAL LOW (ref 1.0–8.0)
Myoglobin, poc: 58.3 ng/mL (ref 12–200)
Myoglobin, poc: 58.6 ng/mL (ref 12–200)
Troponin i, poc: <0.05 ng/mL (ref 0.00–0.09)
Troponin i, poc: <0.05 ng/mL (ref 0.00–0.09)

## 2010-11-20 LAB — MAGNESIUM: Magnesium: 1.9 mg/dL (ref 1.5–2.5)

## 2010-11-20 LAB — CARDIAC PANEL(CRET KIN+CKTOT+MB+TROPI)
CK, MB: 1.3 ng/mL (ref 0.3–4.0)
Relative Index: INVALID (ref 0.0–2.5)
Relative Index: INVALID (ref 0.0–2.5)
Total CK: 56 U/L (ref 7–177)
Total CK: 63 U/L (ref 7–177)
Troponin I: <0.01 ng/mL (ref 0.00–0.06)

## 2010-11-20 LAB — TSH: TSH: 4.543 u[IU]/mL — ABNORMAL HIGH (ref 0.350–4.500)

## 2010-11-20 LAB — COMPREHENSIVE METABOLIC PANEL
ALT: 14 U/L (ref 0–35)
AST: 19 U/L (ref 0–37)
Albumin: 3.4 g/dL — ABNORMAL LOW (ref 3.5–5.2)
CO2: 28 mEq/L (ref 19–32)
Calcium: 8.7 mg/dL (ref 8.4–10.5)
Chloride: 104 mEq/L (ref 96–112)
GFR calc Af Amer: >60 mL/min (ref >60)
GFR calc non Af Amer: >60 mL/min (ref >60)
Sodium: 141 mEq/L (ref 135–145)

## 2010-11-20 LAB — GLUCOSE, CAPILLARY: Glucose-Capillary: 91 mg/dL (ref 70–99)

## 2010-11-20 LAB — RAPID URINE DRUG SCREEN, HOSP PERFORMED: Barbiturates: NOT DETECTED

## 2010-11-20 LAB — CHROMOSOME ANALYSIS, BONE MARROW

## 2010-11-20 LAB — CK TOTAL AND CKMB (NOT AT ARMC): CK, MB: 1.3 ng/mL (ref 0.3–4.0)

## 2010-11-30 ENCOUNTER — Ambulatory Visit: Payer: Self-pay | Admitting: Adult Health

## 2010-12-01 ENCOUNTER — Ambulatory Visit (INDEPENDENT_AMBULATORY_CARE_PROVIDER_SITE_OTHER): Payer: Self-pay | Admitting: Adult Health

## 2010-12-01 ENCOUNTER — Encounter: Payer: Self-pay | Admitting: Adult Health

## 2010-12-01 ENCOUNTER — Ambulatory Visit (INDEPENDENT_AMBULATORY_CARE_PROVIDER_SITE_OTHER)
Admission: RE | Admit: 2010-12-01 | Discharge: 2010-12-01 | Disposition: A | Discharge status: Home / Self Care | Payer: Self-pay | Source: Ambulatory Visit | Attending: Adult Health | Admitting: Adult Health

## 2010-12-01 ENCOUNTER — Telehealth: Payer: Self-pay | Admitting: Adult Health

## 2010-12-01 VITALS — BP 104/58 | HR 95 | Temp 98.1°F | Ht 65.0 in | Wt 108.8 lb

## 2010-12-01 DIAGNOSIS — J209 Acute bronchitis, unspecified: Secondary | ICD-10-CM

## 2010-12-01 DIAGNOSIS — J852 Abscess of lung without pneumonia: Secondary | ICD-10-CM

## 2010-12-01 MED ORDER — AMOXICILLIN-POT CLAVULANATE 875-125 MG PO TABS: 1.0000 | ORAL_TABLET | Freq: Two times a day (BID) | ORAL | Status: AC

## 2010-12-01 NOTE — Patient Instructions (Signed)
Augmentin 875mg  Twice daily  For 10 days  Mucinex DM Twice daily   Increase Prednisone 20mg  daily for 1 week then back  Alternating 10mg  and 5 mg.  follow up in 10 day with Dr. Delford Field in [facility]

## 2010-12-01 NOTE — Assessment & Plan Note (Signed)
CXR today with no sign of reoccurence Will treat for bronchitis with abx -cover for 10 days in case of associated sinus infection/dental involvement.

## 2010-12-01 NOTE — Telephone Encounter (Signed)
LMTCB- needs to take her abx and prednisone as directed. Will await a call back.

## 2010-12-01 NOTE — Progress Notes (Signed)
Subjective:    Patient ID: Valerie Koch, female    DOB: 10/26/1965, 45 y.o.   MRN: [id number]  HPI 44 yo WF referred for evaluation of RUL cavitary lung lesion  This pt has a 3cm nodule LUL cavitary lesion on recent CXR not present 10/11. THis is now present on 06/16/10 film.  The pt's symptoms include: chest pain, wheezing, hoarse, diff time swallowing, high fevers 101-102, The pt has had hectic fevers for 3months.  The pt notes temp spike to 102. There is mucus productive : clear to green. There has been 3 months of cough sometimes is dry and productive.  There is severe chest pain as well  The pt Dx RA for 28yrs. Rx: mtx, arava, gold ., plaq, celebrex, embrel  With Mtx : lfts up , ARAVA did not help. The gold did not help  The embrel given >1.17yr. MTX rx: 45yrs. RHeum MD was [facility] in [place].  FOr RA: Recently now no prednisone and IBup. The pt has been on prednisone on and off for 33yrs. The pt is now on 45m/d pred now. The pt was on 10mg /d for one month and now just bumped to 15/d  This pt has been Dx with prior infections in chest , These episodes are more freq in past year.  There is min smoke exposure, no passive smoke  The pt has been dx bronchitis. There has not been dx of pna in past  The pt does wheeze on L side .  No TB exposure  The pt has hadrecent ABX: zpak. 10/11. No abx in 3-4 weeks.   July 06, 2010 This pt is improved. There is less cough and dyspnea. There is no chest pain. There is no fever. Pt now relates she has had problems with severe dental caries and abscesses in gum. Pt underwent Fob. No endobronch lesions seen  12/01/2010 Acute OV Pt presents with complaints of increased cough, congestion , loss of appetite, increased SOB, wheezing, cough occasionally producing white foamy mucus, tightness in chest, night sweats x6weeks, progressively getting worse. OTC not working. Feels she is getting back to where she was last winter. Seen last in 07/2010 with improvement  after prolonged abx for lung abscess.  Says she felt back to normal until last month .   CXR today shows no acute process.   Pt has hx of RA. Previously on Humira but has been off since she has lung infection last year.  PT has extensive hx of facial reconstruction surgery ~5 years ago after she has facial trauma secondary to fall. Since  That time has had recurrent dental problems and recently had reconstruction bone grafting and tooth extraction.  Her gum disease is felt to be secondary to her previous fall /facial surgery along with immunosuppression with RA and meds (steroids) She has upcoming surgery for more dental work in next few weeks.    Review of Systems Constitutional:   + weight loss, night sweats,  Fevers, chills, fatigue, or  lassitude.  HEENT:   No headaches,  Difficulty swallowing,  Tooth/dental problems, or  Sore throat,                +nasal congestion, post nasal drip,   CV:  No chest pain,  Orthopnea, PND, swelling in lower extremities, anasarca, dizziness, palpitations, syncope.   GI  No heartburn, indigestion, abdominal pain, nausea, vomiting, diarrhea, change in bowel habits, loss of appetite, bloody stools.   Resp: No wheezing.  No chest wall deformity  Skin: no rash or lesions.  GU: no dysuria, change in color of urine, no urgency or frequency.  No flank pain, no hematuria   MS:  No joint pain or swelling.  No decreased range of motion.  No back pain.  Psych:  No change in mood or affect. No depression or anxiety.  No memory loss.         Objective:   Physical Exam GEN: A/Ox3; pleasant , NAD, well nourished   HEENT:  [place]/AT,  EACs-clear, TMs-wnl, NOSE-clear, THROAT-clear, no lesions, no postnasal drip or exudate noted.  Several missing teeth.   NECK:  Supple w/ fair ROM; no JVD; normal carotid impulses w/o bruits; no thyromegaly or nodules palpated; no lymphadenopathy.  RESP  Coarse BS w/ faint exp wheezing.    CARD:  RRR, no m/r/g  , no  peripheral edema, pulses intact, no cyanosis or clubbing.  GI:   Soft & nt; nml bowel sounds; no organomegaly or masses detected.  Musco: Warm bil, no joint swelling +hand changes c/w RA    Neuro: alert, no focal deficits noted.    Skin: Warm, no lesions or rashes          Assessment & Plan:

## 2010-12-01 NOTE — Assessment & Plan Note (Signed)
Acute infection , will cover for associated sinus dz/dental involvement Plan;   Augmentin 875mg  Twice daily  For 10 days  Mucinex DM Twice daily   Increase Prednisone 20mg  daily for 1 week then back  Alternating 10mg  and 5 mg.  follow up in 10 day with Dr. Delford Field in [facility]

## 2010-12-02 NOTE — Progress Notes (Signed)
agree

## 2010-12-05 NOTE — Telephone Encounter (Signed)
lmomtcb x 2  

## 2010-12-06 NOTE — Telephone Encounter (Signed)
lmomtcb x 3  

## 2010-12-07 NOTE — Telephone Encounter (Signed)
Spoke w/ pt and she states when she was seen here on 4/27 TP forgot to give her a breathing tx. Pt states her breathing is some better. I advised her to finish up her pred and abx and see how she feels and if not better to call back and let us know. Pt states she would

## 2010-12-14 ENCOUNTER — Ambulatory Visit: Payer: Self-pay | Admitting: Critical Care Medicine

## 2010-12-19 NOTE — Discharge Summary (Signed)
Valerie Koch, Valerie Koch               ACCOUNT NO.:  [id number]   MEDICAL RECORD NO.:  [id number]          PATIENT TYPE:  INP   LOCATION:  5527                         FACILITY:  [facility]   PHYSICIAN:  Eduard Clos, MDDATE OF BIRTH:  09/08/1965   DATE OF ADMISSION:  08/24/2008  DATE OF DISCHARGE:  08/28/2008                               DISCHARGE SUMMARY   HOSPITAL COURSE:  A 45 year old female with known history of rheumatoid  arthritis, hypertension presents to ER with complaints of chest pain.  The patient was having high blood pressure.  The patient was admitted to  medical floor.  Serial cardiac enzymes were done and EKGs which were  within normal limits.  The patient was started on Diovan and amlodipine.  High blood pressure improved.  The patient had a 2-D echo during stay,  which was showing normal study with the EF of 55 to 60%, normal LV  function.  No regional wall motion abnormalities.  In addition, the  patient was found to have WBC count of 4000 with neutrophils of 27%.  Hematology consult was obtained, the hematologist Dr. Arbutus Ped.  The  patient underwent bone marrow biopsy and was advised to stop lisinopril  and start other medication from other classes, which was done.  The  patient also was on Enbrel and was advised to contact rheumatologist and  try to start on some other medication.  I did speak to Dr. Herbert Seta at  [place] who was a rheumatologist and Dr. Herbert Seta had advised the  patient to stop using Enbrel at this time, which I have conveyed the  message to patient and to contact him for further medications.  Cardiologist consult for chest pain and cardiac CT was done, which was  negative for any acute features.  Hepatology, no further work necessary.  At this time, the patient is hemodynamically stable, afebrile, and eager  to home.  I did discuss with her on-call rheumatologist who advised to  follow up with Dr. Arbutus Ped as scheduled on September 06, 2008,  11:30 a.m.   PROCEDURES DONE DURING THIS STAY:  1. Bone marrow biopsy.  2. CT head without contract on August 24, 2008, nothing acute.  3. Chest x-ray on August 24, 2008, no acute abnormalities.  4. CT of heart August 27, 2008.  5. Hepatic steatosis, chronic right lateral lobe deformities.   PLAN:  The patient advised to stop taking Enbrel and ACE inhibitors.   FOLLOWUP:  To follow with Dr. Arbutus Ped, hematologist on September 06, 2008,  at 11:30 a.m. as scheduled.  To follow with Dr. Audria Nine on September 24, 2008, at 9 a.m. for primary care followup.  To follow up Dr.  Herbert Seta at [facility] to call and make appointment.   To be on cardiac healthy diet.  Medication not advisable for pregnancy.  The patient strongly advised that in the event of any symptoms of fever  or sore throat to go the nearest ER.      Eduard Clos, MD  Electronically Signed     ANK/MEDQ  D:  08/28/2008  T:  08/29/2008  Job:  [id number]

## 2010-12-19 NOTE — Discharge Summary (Signed)
NAMEBLIMA, JAIMES                ACCOUNT NO.:  [id number]   MEDICAL RECORD NO.:  [id number]          PATIENT TYPE:  INP   LOCATION:  3010                         FACILITY:  [facility]   PHYSICIAN:  Pramod P. Pearlean Brownie, MD    DATE OF BIRTH:  06-09-1966   DATE OF ADMISSION:  12/21/2007  DATE OF DISCHARGE:  12/23/2007                               DISCHARGE SUMMARY   DISCHARGE DIAGNOSES:  1. Left leg weakness within setting of negative MRI, ? Transient      ischemic attack versus anxiety.  2. Hypertension.  3. Cigarette smoker.  4. Factor V Leiden deficiency.  5. History of migraine headaches.  6. Rheumatoid arthritis.  7. Ovarian cancer.  8. Repair of rheumatoid tendon.  9. Hysterectomy.  10.Bilaterally oophorectomy for cancer.  11.Parathyroidectomy.   DISCHARGE MEDICATIONS:  Lisinopril 20 mg a day and prednisone 5 mg every  other day (as prior to admission, then aspirin 325 mg a day).   IMAGING STUDIES:  A CT of the brain on admission shows no acute  abnormality.  An MRI of the brain shows no acute intracranial findings.  No change from prior CT.  An MRA of the brain shows moderate  irregularity.  Supraclinoid ICA on the left, suspect flow reducing  lesion, 75% stenosis.  Carotid Doppler shows no ICA stenosis.  Lower  extremity Doppler shows no DVT.  A 2-D echocardiogram performed, results  pending.  Transesophageal echocardiogram shows no cardiac source of  embolus.  No PFO.  EKG shows normal sinus rhythm.   LABORATORY STUDIES:  Shows CBC with neutrophils 27, otherwise normal  chemistry with potassium 2.9 on admission, up to 3.0 after replacement.  Coag studies normal.  Liver function tests normal.  Albumin 2.9.  Cardiac enzymes negative x3.  Cholesterol 140, triglycerides 158, HDL  72, and LDL 36.  Urinalysis is negative.  ANA pending.  CH50 pending.  Sed rate 12, T3 112, and T4 22.  Urine drug screen positive for THC.  Hemoglobin A1c 4.9.  Homocystine 10.3   HISTORY OF PRESENT  ILLNESS:  Ms. Tonjua Rossetti is a 45 year old right-  handed Caucasian female who has a history of factor V Leiden deficiency  and rheumatoid arthritis.  She presents with a 2-day history of left-  sided dull headache with sudden onset of tingling in her left arm and  face that progressed to her leg around 2:30 p.m.  Her symptoms  intensified and were associated with left-sided weakness and slurred  speech at 8:00 p.m.  She arrived by car at [facility] at 8:40 p.m.  Code Stroke was called.  CT of the brain showed no acute abnormalities.  She had minimal symptoms other than the left-sided weakness.  She was  admitted to [facility] for further stroke workup.  She was not  eligible for TPA as she had improved symptoms as well as greater than 3  hours since the symptom onset.  She was admitted for further evaluation.   HOSPITAL COURSE:  MRI was negative for acute stroke, though the story  does sound similar to that of  a TIA.  She has vascular risk factors of  hypertension, cigarette smoker, factor V Leiden, and migraine headaches.  Transesophageal echocardiogram was performed to rule out PFO.  Given her  history of anxiety, it is possible that her symptoms may be associated  with anxiety.  I have placed her on aspirin for stroke prevention.  We  may follow up with Dr. Pearlean Brownie and consider TCD bubble study and emboli  monitoring for further complete evaluation.  Vasculitis labs were also  drawn and those remain pending.  She has been advised to stop smoking.  Of note, she had a low potassium on admission at 3.9, which was repleted  with a total of 80 mEq with potassium raising to 3.0.  We will continue  dietary replacement.  Dr. Pearlean Brownie has been asked to follow her up as an  outpatient and she needs to follow up with her primary care physician.   CONDITION ON DISCHARGE:  The patient is alert and oriented x3.  She had  some mild left hand tingling and numbness, question related to anxiety   per Dr. Marlis Edelson noted.  No neurologic complaints.  No focal neurologic  deficits.   DISCHARGE PLAN:  1. Discharge home with family.  2. Aspirin for stroke prevention.  3. Follow up primary care physician within 1 month and follow up with      Dr. Delia Heady in his office in 2-3 months.  4. Outpatient TCD emboli monitoring and bubble study.      Annie Main, N.P.    ______________________________  Sunny Schlein. Pearlean Brownie, MD    SB/MEDQ  D:  12/23/2007  T:  12/24/2007  Job:  [id number]

## 2010-12-19 NOTE — Discharge Summary (Signed)
[name], [name]               ACCOUNT NO.:  [id number]   MEDICAL RECORD NO.:  [id number]          PATIENT TYPE:  INP   LOCATION:  1311                         FACILITY:  [facility]   PHYSICIAN:  Fleet Contras, M.D.    DATE OF BIRTH:  02/01/1966   DATE OF ADMISSION:  01/31/2009  DATE OF DISCHARGE:  02/03/2009                               DISCHARGE SUMMARY   HISTORY OF PRESENTING ILLNESS:  [name] is a 45 year old Caucasian  lady with past medical history significant for systemic hypertension,  rheumatoid arthritis, bilateral nephrolithiasis status post  parathyroidectomy.  She presented to the emergency room at [facility] with 2 days history of progressively worsening bilateral flank  pain associated with lower abdominal pain, nausea and fevers up to 103.  At the emergency room, she was in severe painful distress requiring  intravenous Dilaudid for some relief.  Her fever was up to 103 degrees  Fahrenheit.  Heart rate was 15, blood pressure 152/90.  Initial  laboratory data showed a urinalysis positive for nitrites and leukocyte  esterase and 21-50 WBCs per high-power field and RBCs too numerous to  count.  She was admitted to the hospital as a case of pyelonephritis.   HOSPITAL COURSE:  On admission, she was started on intravenous Rocephin  1 g daily.  Her pain was controlled with intravenous Dilaudid 0.5 to 1  mg q.4 h. p.r.n.  She received DVT prophylaxis with subcutaneous Lovenox  and Protonix 40 mg p.o. for GI prophylaxis.  A CT scan of her abdomen  and pelvis was performed.  This revealed bilateral nephrolithiasis  without hydronephrosis.  She stated that she passed a stone while she  was in the hospital and gave her some relief.  She had a low potassium  on admission of 3.0, and this was repleted both intravenously as well as  orally.   Today, she is feeling much better.  She still has some soreness in the  left flank.  She is eating well.  Vital signs are  stable.  She is  afebrile for the last 24 hours.  Chest is clear to auscultation.  Abdomen is benign.  Bowel sounds are present.  Extremities shows no  edema.  She is alert and oriented x3 with no focal neurological  deficits.   LABORATORY DATA:  Shows a white count of 4.5, hemoglobin 9.8, hematocrit  28.3, platelet count of 71,000.  Sodium is 139, potassium 4.4, chloride  110, bicarbonate of 25, BUN 8, creatinine 0.86 and glucose 91.   She is now considered stable for discharge home today.   DISCHARGE DIAGNOSES:  1. Acute pyelonephritis.  2. Bilateral nephrolithiasis.  3. Hypokalemia.  4. Pancytopenia; this is chronic.  5. History of rheumatoid arthritis being followed by rheumatologist in      [place].   CONDITION ON DISCHARGE:  Stable.  Her disposition is for home.   Her discharge medications will include:  1. Bystolic 20 mg once a day.  2. Aspirin 325 mg daily.  3. Folic acid 1 mg daily.  4. Potassium chloride 10 mEq once a day.  5. Ciprofloxacin 500 mg p.o. b.i.d. for 7 days.  6. Percocet 5/325 one to two p.o. q.6 h. p.r.n. for pain.   She will follow up with me in the office in about 1-2 weeks.  This  discharge plan was discussed with her and her questions answered.      Fleet Contras, M.D.  Electronically Signed     EA/MEDQ  D:  02/03/2009  T:  02/03/2009  Job:  [id number]

## 2010-12-19 NOTE — H&P (Signed)
[name], Valerie Koch               ACCOUNT NO.:  [id number]   MEDICAL RECORD NO.:  [id number]          PATIENT TYPE:  INP   LOCATION:  1311                         FACILITY:  [facility]   PHYSICIAN:  Fleet Contras, M.D.    DATE OF BIRTH:  1966-03-26   DATE OF ADMISSION:  01/31/2009  DATE OF DISCHARGE:                              HISTORY & PHYSICAL   ADMISSION DIAGNOSIS:  Renal failure.   DISCHARGE DIAGNOSIS:  Renal failure.   PRESENTING COMPLAINTS:  Fever and flank pains.   HISTORY OF PRESENT ILLNESS:  Valerie Koch is a 45 year old Caucasian lady  with past medical history significant for systemic hypertension,  rheumatoid arthritis, previously treated with steroids and Enbrel.  She  presented to the emergency room at [facility] with a 2 day  history of progressively worsening bilateral flank pains associated with  lower abdominal pain, nausea and fevers up to 103.  At the emergency  room, she was noted to be in moderately severe painful distress.  She  received intravenous Dilaudid 1 mg with some improvement in her  symptoms.  Her initial laboratory data showed a fever of 103 degrees  Fahrenheit, heart rate of 115, blood pressure 152/90.  Laboratory data  revealed urinalysis with positive nitrites, positive leukocyte esterase  and 21-50 WBC per high-powered field with blood and RBCs too numerous to  count.  She was therefore admitted to the hospital as a case of  pyelonephritis and started on intravenous antibiotics.   PAST MEDICAL HISTORY:  1. Systemic hypertension.  2. History of cervical cancer.  3. Factor V Leiden mutation.  4. Rheumatoid arthritis.  5. Chronic hypokalemia.   PAST SURGICAL HISTORY:  1. Cholecystectomy.  2. Hysterectomy.  3. Parathyroidectomy.   FAMILY/SOCIAL HISTORY:  See denies any use of alcohol, tobacco or  illicit drugs.   CURRENT MEDICATIONS:  1. Bystolic 20 mg once a day.  2. Aspirin 325 mg once a day.  3. Ibuprofen 800 mg t.i.d.  p.r.n.  4. Potassium chloride 10 mEq daily.  5. She is currently off Enbrel and prednisone.   REVIEW OF SYSTEMS:  GENERAL:  She did have some fevers and chills.  GI:  She has some nausea, no vomiting, no diarrhea.  GU:  As above.  CARDIAC:  She has no chest pain or shortness of breath.  RESPIRATORY:  No cough,  sputum production or hemoptysis.  CNS:  She has some headaches, but no  dizziness, syncope or seizures.   PHYSICAL EXAMINATION:  GENERAL:  She is lying in the hospital bed not in  acute respiratory distress.  VITAL SIGNS:  She is febrile with temperature of 102.8, blood pressure  is 136/94, heart rate is 107, respiratory rate 20, temperature 102.8 and  O2 sat is 97% on room air.  CHEST:  Clear to auscultation.  HEART:  Sounds S1 and S2 are heard with no murmurs.  ABDOMEN:  Full, flat, tender in the left flank as well as suprapubic  region.  There is no organomegaly, no guarding or rebound tenderness.  Bowel sounds are present.  EXTREMITIES:  Shows no edema,  calf tenderness  or swelling.  CNS:  She is alert and oriented x3 with no focal neurological deficits.   LABORATORY DATA:  White count is 4.5, hemoglobin 11.8, hematocrit 33.8,  platelet count of 94,000, sodium is 134, potassium 2.8, chloride 100,  bicarbonate of 25, glucose is 102, BUN 12, creatinine 0.99.  Liver  function test is normal.  Albumin is 3.3.  Pregnancy test is negative.  Urinalysis:  Large blood, positive nitrite, moderate leukocyte esterase,  21-50 WBC per high-power field and RBCs too numerous to count, many  bacteria.   ASSESSMENT:  Valerie Koch is a 45 year old Caucasian lady admitted via the  emergency room with symptoms and signs suggestive of pyelonephritis.  She will be started on antibiotics, pain regimen and monitored over the  next 24 hours.   ADMISSION DIAGNOSES:  1. Acute pyelonephritis.  She has a history of right kidney stones and      apparently had a parathyroidectomy related to that.  2.  Hypokalemia.  3. Pancytopenia.  4. History of rheumatoid arthritis.   PLAN OF CARE:  1. She will be on IV Rocephin 1 gm daily.  2. She will receive pain medication with IV Dilaudid 0.5 to 1 mg q.4      h. p.r.n.  3. Zofran 4 mg IV q.4 h. p.r.n. for nausea and vomiting.  4. Subcutaneous Lovenox 40 mg daily for DVT prophylaxis.  5. Protonix 40 mg p.o. daily for GI prophylaxis.  6. Continue her home medications as above.  7. CT scan of the abdomen and pelvis with stone protocol to be      performed in the a.m.  8. We will replete potassium and magnesium levels to be checked      regularly.   This care plan has been discussed with her and her questions answered.      Fleet Contras, M.D.  Electronically Signed     EA/MEDQ  D:  02/01/2009  T:  02/01/2009  Job:  [id number]

## 2010-12-19 NOTE — H&P (Signed)
Valerie Koch, Valerie Koch                ACCOUNT NO.:  [id number]   MEDICAL RECORD NO.:  [id number]          PATIENT TYPE:  INP   LOCATION:  3010                         FACILITY:  [facility]   PHYSICIAN:  Deanna Artis. Hickling, M.D.DATE OF BIRTH:  Dec 11, 1965   DATE OF ADMISSION:  12/21/2007  DATE OF DISCHARGE:                              HISTORY & PHYSICAL   CHIEF COMPLAINT:  Numbness and weakness on left side, beginning at 2:30  p.m.   HISTORY OF PRESENT ILLNESS:  Two day history of left-sided dull headache  in woman who has a 20-plus year history of rheumatoid arthritis and  factor V Leiden deficiency.  The patient had onset of tingling in her  left arm and face that progressed to her leg around 1430 hours.  Her  symptoms intensified and were associated with left-sided weakness and  slurred speech around 2000 hours.  She arrived by car to [facility] at  2040 hours.  Code stroke was called at 2101 hours.  CT brain carried out  at the same time.  I reviewed it at 2115 hours and it was normal.   PAST MEDICAL HISTORY:  Limb and neck pain secondary to rheumatoid  arthritis; headaches, a tension type, rare migraines, none recently;  ovarian cancer.   PAST SURGICAL HISTORY:  Repair of rheumatoid tendon, hysterectomy,  bilateral oophorectomy for cancer, parathyroidectomy.   MEDICATIONS:  Lisinopril 20 mg daily.   DRUG ALLERGIES:  None.   FAMILY HISTORY:  Positive for deep vein thrombosis, pulmonary embolus,  factor V Leiden deficiency in her mother who died suddenly at age 37.  Father, age 28, has hypertension and cholesterol problems.  Siblings  have factor V Leiden deficiency.  She has 2 children, I do not know if  they have been tested.   SOCIAL HISTORY:  The patient smokes 2 cigarettes a day for 2 years.  She  is a social drinker.  She takes marijuana.  She is married with 2  children.  She works for an [employer].  She has no insurance.  She  moved here recently from the [place]  area.   PHYSICAL EXAMINATION:  VITAL SIGNS:  Blood pressure initially 180/105,  now 156/73; resting pulse 86; respirations 18; oxygen saturation 99%;  temperature 98.6.  EARS, NOSE, AND THROAT:  No infections.  No bruits.  NECK:  Supple neck.  Full range of motion.  LUNGS:  Clear.  HEART:  No murmurs.  Pulses normal.  ABDOMEN:  Soft.  Bowel sounds normal.  No hepatosplenomegaly.  EXTREMITIES:  Normal.  NEUROLOGIC:  Mental Status: Awake, alert without dysphasia or dyspraxia.  Cranial Nerves: Round, reactive pupils.  Visual fields full.  Extraocular movements full.  Symmetric facial strength.  Midline tongue  and uvula.  Air conduction greater than bone conduction bilaterally.   Motor examination normal strength, tone, and mass.  Good fine motor  movements.  No pronator drift.  The only weakness is a left hip flexor  4/5.  Sensation, left hemihypesthesia that respects the midline.  She  has good stereoagnosis bilaterally.  Cerebellar examination:  Finger-to-  nose and  rapid alternating movements were good.  Heel-knee-shin was  worse on the left side because of weakness.  Gait was not tested.  Deep  tendon reflexes were brisk diffusely.  She had bilateral flexor plantar  responses.   IMPRESSION:  1. Left leg weakness, left body numbness, evaluate for right brain      infarction in posterior limb of the internal capsule.      (344.32,782.0)  2. Hypertension in poor control. (404.01)  3. Smoker.  4. Factor V Leiden deficiency.   PLAN:  Admit to the hospital for a diagnostic workup including MRI scan  brain, MRA intracranial, 2D echocardiogram, carotid Doppler, laboratory  studies for fasting lipid, hemoglobin A1c, and serum homocysteine.  The  patient will be placed on aspirin 325 mg.  She passed her swallowing  screen.  She is not eligible for TPA because she is improved from the  time that she was initially seen in the emergency room and she was  greater than 3 hours after the  onset of her symptoms.  She is not  eligible for any of our stroke studies.      Deanna Artis. Sharene Skeans, M.D.  Electronically Signed     [facility]/MEDQ  D:  12/21/2007  T:  12/22/2007  Job:  [id number]

## 2010-12-19 NOTE — Consult Note (Signed)
Valerie Koch, Valerie Koch               ACCOUNT NO.:  [id number]   MEDICAL RECORD NO.:  [id number]          PATIENT TYPE:  INP   LOCATION:  5507                         FACILITY:  [facility]   PHYSICIAN:  Lajuana Matte, MD  DATE OF BIRTH:  06-16-1966   DATE OF CONSULTATION:  08/25/2008  DATE OF DISCHARGE:                                 CONSULTATION   REASON FOR CONSULTATION:  Neutropenia.   REQUESTING PHYSICIAN:  [facility].   HISTORY OF PRESENT ILLNESS:  Valerie Koch is a pleasant 45 year old woman  with a past medical history significant for hypertension, rheumatoid  arthritis and a history of cervical cancer status post hysterectomy,  along with a history of factor V Leiden and TIA secondary to  hypertension.  She was admitted for uncontrolled hypertension with  nausea and mild neurological symptoms.  During her evaluation, she had a  CBC showing normal total leukocyte count, but low neutrophil count of  300.  Dr. Shirline Frees was consulted to evaluate the patient for this  abnormality.  She had been on Enbrel for several years, which was  resumed in August of 2008.  The patient had stopped this medication  previously, due to the significant cost of the medicine.  She had blood  work done in August 2008, before starting Enbrel, but none since that  time until today.  She has no local primary care physician or  rheumatologist.  She is feeling fine with no other significant  complaints.  The patient is afebrile, with no palpable lymphadenopathy,  no weight loss or night sweats.  She is also on lisinopril for the  treatment of her hypertension.  We were asked to see her, with  recommendations regarding her care.   PAST MEDICAL HISTORY:  1. Hypertension.  2. History of rheumatoid arthritis with hand and foot deformities      advancing.  3. Secondary Sjogren's syndrome by history.  4. History of cervical cancer, evaluated in [place], [place].      The patient does not recall the  doctor's name, but will continue to      evaluate and await any form of records for further details.  5. History of TIAs secondary to hypertension.   SURGERIES/PROCEDURE:  Status post parathyroidectomy, status post  hysterectomy, status post cholecystectomy.   ALLERGIES:  NKDA.   MEDICATIONS:  1. Ecotrin 325 mg daily.  2. Lovenox 40 mg daily.  3. K-Dur 48 mEq every 6 hours as directed.  4. Senokot nightly.  5. Tylenol 650 mg every 6 hours p.r.n.  6. Dulcolax 10 mg PR p.r.n.  7. Dilaudid 1 mg IV every 4 hours p.r.n.  8. Zofran 4 mg IV every 6 hours p.r.n.  9. Roxicodone 5 mg every 4 hours p.r.n.  10.At home the patient is on Enbrel, aspirin and lisinopril/HCTZ.   REVIEW OF SYSTEMS:  Remarkable for no fever or chills. She does have  occasional night sweats, as well as headaches, with last episode  yesterday.  No confusion.  She has some blurred vision when the headache  episodes appear  No dysphagia.  No shortness of  breath or dyspnea on  exertion.  She had chest pain on admission, which has now resolved.  No  cough, abdominal pain, weight loss, failure to thrive.  She does have  GERD symptoms.  She had some nausea and vomiting prior to admission,  resolved.  No diarrhea or constipation.  No blood in the stools or in  the urine.  She has chronic back pain secondary to rheumatoid arthritis.  No numbness or swelling.  She has no fatigue.  Rest of the review of  systems is negative.   FAMILY HISTORY:  Mother died with what she describes as blood clots.  She also had a factor V Leiden.  She died at age 46.  Father is alive  with a history of blood clots as well.  He also has history of  hypertension.  She has four brothers, two with factor V mutation and  hypertension and two sisters, which are alive and well.   SOCIAL HISTORY:  The patient is married.  She has two children and three  grandchildren.  Her daughters are now expecting one child each, which  will make eventually five  grandchildren in total.  No tobacco or alcohol  history.  The patient lives in [place].  Her last physical was 1 year  ago.  Last mammogram was many years ago.  She does not recall when.   PHYSICAL EXAM:  This is a 45 year old white female in no acute distress,  alert and oriented x3.  Blood pressure 149/98, pulse 100, respirations  20, temperature 98.4, pulse oximetry 97% on room air.  Weight 56.1 kg,  height 66 inches.  HEENT:  Normocephalic, atraumatic.  PERRLA.  Oral cavity without lesions  or thrush.  NECK:  Supple.  No cervical or supraclavicular masses.  LUNGS:  Clear to auscultation bilaterally.  No axillary masses.  CARDIOVASCULAR:  Regular rate and rhythm without murmurs, rubs or  gallops.  ABDOMEN:  Soft, nontender.  Bowel sounds x4.  No hepatosplenomegaly.  EXTREMITIES:  No clubbing or cyanosis.  No edema.  No inguinal masses.  SKIN:  Without any areas of bruising or petechial rash.  There are some  suspicious moles on her back, and there is a well-healed surgical scar  on the neck.  BREASTS:  Not examined.  GU/RECTAL:  Deferred.  MUSCULOSKELETAL:  No spinal tenderness.  She has rheumatoid arthritis,  therefore she has some areas of tenderness in her extremities.  NEURO:  Nonfocal.   LABORATORIES:  Hemoglobin 11.6, hematocrit 34, white count 4.8,  platelets 142, ANC 0.3, monocytes 1.0, lymphocytes 3.2, sodium 141,  potassium 2.2, BUN 14, creatinine 0.91, glucose 91, total bilirubin 0.5,  alkaline phosphatase 60, AST 19, ALT 14, total protein 6.3, albumin 3.4,  calcium 8.7, TSH 4.543.  Troponin negative.   ASSESSMENT/PLAN:  Dr. Shirline Frees has seen and evaluated the patient and  reviewed the chart.  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  However, one  cannot rule out infiltrative bone marrow process at this point.   PLAN:  1. Change  lisinopril to a different class of antihypertensive      medication.  2. Continue Enbrel for now and consult with her rheumatologist      regarding the need to continue with the drug or to change to a      different medication.  3. If no improvement in  her count with the above, we will consider the      patient for bone marrow biopsy and aspirate to rule out other      etiologies of her agranulocytosis.   Thank you very much for allowing [place] the opportunity to participate in  this nice patient's care.  We will continue to follow up with you as  needed.      Marlowe Kays, P.A.      Lajuana Matte, MD  Electronically Signed    SW/MEDQ  D:  08/25/2008  T:  08/25/2008  Job:  [id number]

## 2010-12-19 NOTE — H&P (Signed)
Valerie Koch, Valerie Koch               ACCOUNT NO.:  [id number]   MEDICAL RECORD NO.:  [id number]          PATIENT TYPE:  INP   LOCATION:  5502                         FACILITY:  [facility]   PHYSICIAN:  Lucita Ferrara, MD         DATE OF BIRTH:  08/23/1965   DATE OF ADMISSION:  08/24/2008  DATE OF DISCHARGE:                              HISTORY & PHYSICAL   CHIEF COMPLAINT:  Elevated blood pressure and chest discomfort.   HISTORY OF PRESENT ILLNESS:  The patient is a 45 year old Caucasian  female with a history of uncontrolled high blood pressure who presents  with erratic blood pressures as high as 161/102.  She complains of  associated headache, chest pressure on and off.  At home, she took  lisinopril/hydrochlorothiazide.  Supposedly, she has also tried to seek  primary care, who had told her that she was a liability.  She denies  any nausea or vomiting.  She denies any focal neurological deficits.  She denies blurred vision.  She has got risk factors including; factor V  Leiden deficiency.   PAST MEDICAL HISTORY:  1. Hypertension.  2. Cervical cancer.  3. Factor V Leiden mutation.  4. Rheumatoid arthritis.   PAST SURGICAL HISTORY:  1. Status post cholecystectomy.  2. Hysterectomy.  3. Parathyroidectomy.   SOCIAL HISTORY:  The patient denies drugs, alcohol or tobacco.   ALLERGIES:  1. HYDROCODONE.  2. MORPHINE.   MEDICATIONS:  1. Enbrel 50 injection, specialized dosing.  2. Aspirin 325 mg once daily.  3. Lisinopril/hydrochlorothiazide 24/12.5 twice daily.   REVIEW OF SYSTEMS:  As per HPI, otherwise negative.   PHYSICAL EXAMINATION:  GENERAL:  The patient is in no acute distress.  VITAL SIGNS:  Blood pressure currently is 136/89, pulse 91, respirations  12, temperature 97.7.  HEENT:  Normocephalic, atraumatic.  Sclerae is  nonicteric.  PERRLA.  Extraocular muscles intact.  NECK:  Supple.  No JVD, no carotid bruits.  CARDIOVASCULAR:  S1-S2.  Regular rate and rhythm.  No  murmurs, rubs or  clicks.  LUNGS:  Clear to auscultation bilaterally.  No rhonchi, rales or  wheezes.  ABDOMEN:  Soft, nontender, nondistended.  Positive bowel sounds.  EXTREMITIES:  Without clubbing, cyanosis or edema.  NEURO:  The patient is alert and oriented x3.  Cranial nerves II through  XII grossly intact.   EKG 85, nonspecific ST-T wave changes.   LABORATORY DATA:  Cardiac markers negative.  Urinalysis okay.   ASSESSMENT:  The patient is a 45 year old with;  1. Emergent hypertension/erratic blood pressures.  2. Headaches.  3. History of transient ischemic attacks.  4. Factor V Leiden deficiency.  5. Chest pain.   The patient will be admitted to the medical telemetry unit.  Will cycle  her cardiac enzymes x3 every 8 hours.  ACS protocol for chest pain rule  out.  As far as her headache, it may be likely associated with her high  blood pressure.  CT scan of the head is, however, negative and she has  no focal neurological deficits.  I do not think that she is having  any  ischemic type of changes, although this is still in the differential  diagnosis.  It would be reasonable to rule out secondary causes of  hypertension, including renal artery stenosis and other causes through  this admission.  Factor V Leiden deficiency.  She denies history of PE  or DVTs in the past.  She is not on Coumadin.  This also proposes an  extra risk for ischemic events as well.  We will routinely put her on  DVT prophylaxis.  Will need to get any prior records, any workups she  has had done on her blood pressure issues and will need to get  appropriate consultations involved.  The rest of the plans are dependent  on her progress.      Lucita Ferrara, MD  Electronically Signed     RR/MEDQ  D:  08/25/2008  T:  08/25/2008  Job:  [id number]

## 2010-12-21 ENCOUNTER — Encounter: Payer: Self-pay | Admitting: Critical Care Medicine

## 2010-12-23 ENCOUNTER — Telehealth: Payer: Self-pay | Admitting: Pulmonary Disease

## 2010-12-23 NOTE — Telephone Encounter (Signed)
Pt followed by Dr. Delford Field for RUL cavitary lesion.  Admitted to [facility] yesterday for chest pain.  Dr. Aline August contacted office requesting medical records in reference to Rt upper lobe cavitary lesion evaluation.  Faxed records to [phone].  Will forward to Dr. Delford Field for his review.

## 2010-12-25 NOTE — Telephone Encounter (Signed)
Noted  

## 2010-12-28 ENCOUNTER — Ambulatory Visit: Payer: BC Managed Care – PPO | Admitting: Critical Care Medicine

## 2011-01-04 ENCOUNTER — Encounter: Payer: Self-pay | Admitting: Critical Care Medicine

## 2011-01-04 ENCOUNTER — Ambulatory Visit (INDEPENDENT_AMBULATORY_CARE_PROVIDER_SITE_OTHER): Payer: BC Managed Care – PPO | Admitting: Critical Care Medicine

## 2011-01-04 ENCOUNTER — Encounter: Payer: Self-pay | Admitting: *Deleted

## 2011-01-04 DIAGNOSIS — Z832 Family history of diseases of the blood and blood-forming organs and certain disorders involving the immune mechanism: Secondary | ICD-10-CM | POA: Insufficient documentation

## 2011-01-04 DIAGNOSIS — D6851 Activated protein C resistance: Secondary | ICD-10-CM

## 2011-01-04 DIAGNOSIS — J209 Acute bronchitis, unspecified: Secondary | ICD-10-CM

## 2011-01-04 DIAGNOSIS — J852 Abscess of lung without pneumonia: Secondary | ICD-10-CM

## 2011-01-04 DIAGNOSIS — D6859 Other primary thrombophilia: Secondary | ICD-10-CM

## 2011-01-04 MED ORDER — AMOXICILLIN-POT CLAVULANATE 875-125 MG PO TABS: 1.0000 | ORAL_TABLET | Freq: Two times a day (BID) | ORAL | Status: AC

## 2011-01-04 NOTE — Assessment & Plan Note (Signed)
Hx large RUL lung abscess/cyst slow to resolve. 12/23/10 CT reveals small mucoid impaction cavitary nodule 13mm diam Hx of periodontal disease as etiology in addition to RA and immunosuppressive therapy  I can see the nodular lesion on current CT but feel this is dramatically better vs abscess seen on 2011 films Pt still with recurrent bronchitis, note prior FOB neg for endobronchial lesion Plan 14 days of augmentin 875mg  bid  Obtain sputum c/s Letter of med necessity for further dental work given to patient

## 2011-01-04 NOTE — Progress Notes (Signed)
Subjective:    Patient ID: Valerie Koch, female    DOB: February 12, 1966, 45 y.o.   MRN: [id number]  HPI 45 y.o. WF referred for evaluation of RUL cavitary lung lesion  This pt has a 3cm nodule LUL cavitarylesion on recent CXR not present 10/11. THis is now present on 06/16/10 film.  The pt's symptoms include: chest pain, wheezing, hoarse, diff time swallowing, high fevers 101-102, The pt has had hectic fevers for 3months.  The pt notes temp spike to 102. There is mucus productive : clear to green. There has been 3 months of cough sometimes is dry and productive.  There is severe chest pain as well  The pt Dx RA for 63yrs. Rx: mtx, arava, gold ., plaq, celebrex, embrel  With Mtx : lfts up , ARAVA did not help. The gold did not help  The embrel given >1.74yr. MTX rx: 5yrs. RHeum MD was [facility] in [place].  FOr RA: Recently now no prednisone and IBup. The pt has been on prednisone on and off for 21yrs. The pt is now on 39m/d pred now. The pt was on 10mg /d for one month and now just bumped to 15/d  This pt has been Dx with prior infections in chest , These episodes are more freq in past year.  There is min smoke exposure, no passive smoke  The pt has been dx bronchitis. There has not been dx of pna in past  The pt does wheeze on L side .  No TB exposure  The pt has hadrecent ABX: zpak. 10/11. No abx in 3-4 weeks.   July 06, 2010 12:04 PM  This pt is improved. There is less cough and dyspnea. There is no chest pain. There is no fever. Pt now relates she has had problems with severe dental caries and abscesses in gum.  Pt underwent Fob. No endobronch lesions seen   01/04/2011 Not seen since 12/11 Back in hosp 5/19 until 5/20 at [facility] [place]  Pt adm with cough and congestion. CT Chest with small cavitary nodule RUL 13mm in diameter.  Pt  Sent home without ABX.  Pt still has further dental work to obtain.     Now: has tightness, dyspnea with any exertion,  Notes gray mucus.  Notes chest pain.    Denies any sinus complaints. Pt denies any significant sore throat, nasal congestion or excess secretions, fever, chills, sweats, unintended weight loss, pleurtic or exertional chest pain, orthopnea PND, or leg swelling Pt denies any increase in rescue therapy over baseline, denies waking up needing it or having any early am or nocturnal exacerbations of coughing/wheezing/or dyspnea. Pt also denies any obvious fluctuation in symptoms with  weather or environmental change or other alleviating or aggravating factors    Past Medical History  Diagnosis Date  . History of TIAs 01/2010, 12/2006, 08/2007,    d/t HTN  . Leukopenia   . Anxiety   . Rheumatoid arthritis   . HTN (hypertension)   . Cervical cancer     hysterectomy 2003  . Chronic periodontal disease     s/p fall, pins and plates in chin  . Diverticulosis   . Lung abscess      Family History  Problem Relation Age of Onset  . Factor V Leiden deficiency Mother   . Diabetes Father   . Hypertension Mother   . Hypertension Father   . Asthma Brother   . Asthma Daughter      History   Social History  .  Marital Status: Married    Spouse Name: N/A    Number of Children: 2  . 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 Smoking status: Former Smoker -- 0.2 packs/day for 2 years    Types: Cigarettes    Quit date: 08/07/2007  . Smokeless tobacco: Never Used  . Alcohol Use: No  . Drug Use: No  . Sexually Active: Not on file   Other Topics Concern  . Not on file   Social History Narrative   2 daughters5 grandchildren     Allergies  Allergen Reactions  . Codeine     GI discomfort > "feels like a hot poker to the stomach"  . Morphine     REACTION: GI upset     Outpatient Prescriptions Prior to Visit  Medication Sig Dispense Refill  . alprazolam (XANAX) 2 MG tablet Take 1 mg by mouth At bedtime.       Marland Kitchen amLODipine (NORVASC) 5 MG tablet Take 5 mg by  mouth daily.       . carvedilol (COREG) 6.25 MG tablet Take 6.25 mg by mouth daily.       Marland Kitchen KLOR-CON 10 10 MEQ CR tablet Take 10 mEq by mouth 3 (three) times daily.       Marland Kitchen omeprazole (PRILOSEC) 40 MG capsule Take 40 mg by mouth daily before breakfast.       . predniSONE (DELTASONE) 5 MG tablet Take 5 mg by mouth daily.       Marland Kitchen spironolactone (ALDACTONE) 25 MG tablet Take 25 mg by mouth daily.       . traZODone (DESYREL) 50 MG tablet Take 50 mg by mouth at bedtime.       Marland Kitchen zolpidem (AMBIEN) 5 MG tablet Take 5 mg by mouth at bedtime as needed.       Marland Kitchen HUMIRA PEN 40 MG/0.8ML injection 40 mg every 14 (fourteen) days.       . cephALEXin (KEFLEX) 500 MG capsule Take 500 mg by mouth 2 (two) times daily.          Review of Systems Constitutional:   No  weight loss, night sweats,  Fevers, chills, fatigue, lassitude. HEENT:   No headaches,  Difficulty swallowing,  Tooth/dental problems,  Sore throat,                No sneezing, itching, ear ache, nasal congestion, post nasal drip,   CV:  No chest pain,  Orthopnea, PND, swelling in lower extremities, anasarca, dizziness, palpitations  GI  No heartburn, indigestion, abdominal pain, nausea, vomiting, diarrhea, change in bowel habits, loss of appetite  Resp: Notes  shortness of breath with exertion not at rest.  Notes  excess grey mucus, no productive cough,  No non-productive cough,  No coughing up of blood.  No change in color of mucus.  No wheezing.  No chest wall deformity  Skin: no rash or lesions.  GU: no dysuria, change in color of urine, no urgency or frequency.  No flank pain.  MS:  No joint pain or swelling.  No decreased range of motion.  No back pain.  Psych:  No change in mood or affect. No depression or anxiety.  No memory loss.     Objective:   Physical Exam Filed Vitals:   01/04/11 1154  BP: 130/80  Pulse: 105  Temp: 98.2 F (36.8 C)  TempSrc: Oral  Height: 5'  5" (1.651 m)  Weight: 114 lb (51.71 kg)  SpO2: 100%     Gen: Pleasant, well-nourished, in no distress,  normal affect  ENT: No lesions,  mouth clear,  oropharynx clear, no postnasal drip  Neck: No JVD, no TMG, no carotid bruits  Lungs: No use of accessory muscles, no dullness to percussion, clear without rales or rhonchi  Cardiovascular: RRR, heart sounds normal, no murmur or gallops, no peripheral edema  Abdomen: soft and NT, no HSM,  BS normal  Musculoskeletal: No deformities, no cyanosis or clubbing  Neuro: alert, non focal  Skin: Warm, no lesions or rashes        Assessment & Plan:   Abscess of lung Hx large RUL lung abscess/cyst slow to resolve. 12/23/10 CT reveals small mucoid impaction cavitary nodule 13mm diam Hx of periodontal disease as etiology in addition to RA and immunosuppressive therapy  I can see the nodular lesion on current CT but feel this is dramatically better vs abscess seen on 2011 films Pt still with recurrent bronchitis, note prior FOB neg for endobronchial lesion Plan 14 days of augmentin 875mg  bid  Obtain sputum c/s Letter of med necessity for further dental work given to patient    Updated Medication List Outpatient Encounter Prescriptions as of 01/04/2011  Medication Sig Dispense Refill  . alprazolam (XANAX) 2 MG tablet Take 1 mg by mouth At bedtime.       Marland Kitchen amLODipine (NORVASC) 5 MG tablet Take 5 mg by mouth daily.       Marland Kitchen aspirin 325 MG tablet Take 325 mg by mouth daily.        . carvedilol (COREG) 6.25 MG tablet Take 6.25 mg by mouth daily.       Marland Kitchen KLOR-CON 10 10 MEQ CR tablet Take 10 mEq by mouth 3 (three) times daily.       . Multiple Vitamin (MULTIVITAMIN) tablet Take 1 tablet by mouth daily.        . Omega-3 Fatty Acids (FISH OIL PO) Take 1 capsule by mouth 2 (two) times daily.        Marland Kitchen omeprazole (PRILOSEC) 40 MG capsule Take 40 mg by mouth daily before breakfast.       . predniSONE (DELTASONE) 5 MG tablet Take 5 mg by mouth daily.       . Probiotic Product (PROBIOTIC PO) Take by  mouth daily.        Marland Kitchen spironolactone (ALDACTONE) 25 MG tablet Take 25 mg by mouth daily.       . traZODone (DESYREL) 50 MG tablet Take 50 mg by mouth at bedtime.       Marland Kitchen zolpidem (AMBIEN) 5 MG tablet Take 5 mg by mouth at bedtime as needed.       Marland Kitchen amoxicillin-clavulanate (AUGMENTIN) 875-125 MG per tablet Take 1 tablet by mouth 2 (two) times daily.  28 tablet  0  . HUMIRA PEN 40 MG/0.8ML injection 40 mg every 14 (fourteen) days.       Marland Kitchen DISCONTD: cephALEXin (KEFLEX) 500 MG capsule Take 500 mg by mouth 2 (two) times daily.

## 2011-01-04 NOTE — Patient Instructions (Signed)
Take Augmentin again for 14 days Return [place] for recheck in two weeks A letter for your dental work will be prepared I will review records of your Hospital Stay I will review your CT Scan with our radiology dept Obtain a sputum culture

## 2011-01-18 ENCOUNTER — Ambulatory Visit: Payer: BC Managed Care – PPO | Admitting: Adult Health

## 2011-05-02 LAB — HEMOGLOBIN A1C
Hgb A1c MFr Bld: 4.9
Mean Plasma Glucose: 97

## 2011-05-02 LAB — URINALYSIS, ROUTINE W REFLEX MICROSCOPIC
Bilirubin Urine: NEGATIVE
Nitrite: NEGATIVE
Specific Gravity, Urine: 1.01
pH: 7.5

## 2011-05-02 LAB — COMPREHENSIVE METABOLIC PANEL
ALT: 11
AST: 19
CO2: 33 — ABNORMAL HIGH
Calcium: 9.2
Chloride: 102
GFR calc Af Amer: >60
GFR calc non Af Amer: >60
Sodium: 142
Total Bilirubin: 0.4

## 2011-05-02 LAB — DIFFERENTIAL
Basophils Absolute: 0.1
Eosinophils Absolute: 0.4
Eosinophils Relative: 5
Lymphocytes Relative: 47 — ABNORMAL HIGH

## 2011-05-02 LAB — BASIC METABOLIC PANEL
BUN: 7
Calcium: 9.1
Creatinine, Ser: 0.75
GFR calc Af Amer: >60
GFR calc non Af Amer: >60

## 2011-05-02 LAB — CBC
HCT: 38.5
MCV: 82.6
Platelets: 262
RDW: 14.6

## 2011-05-02 LAB — CARDIAC PANEL(CRET KIN+CKTOT+MB+TROPI)
CK, MB: 1
Relative Index: INVALID
Relative Index: INVALID
Total CK: 65
Total CK: 67
Troponin I: 0.02
Troponin I: 0.03

## 2011-05-02 LAB — RAPID URINE DRUG SCREEN, HOSP PERFORMED
Opiates: NOT DETECTED
Tetrahydrocannabinol: POSITIVE — AB

## 2011-05-02 LAB — POCT I-STAT, CHEM 8
Calcium, Ion: 1.13
Creatinine, Ser: 1
Glucose, Bld: 137 — ABNORMAL HIGH
Hemoglobin: 13.6
TCO2: 31

## 2011-05-02 LAB — LIPID PANEL: HDL: 36 — ABNORMAL LOW

## 2011-05-02 LAB — PROTIME-INR: Prothrombin Time: 12.5

## 2011-05-02 LAB — C3 COMPLEMENT: C3 Complement: 112

## 2011-06-27 ENCOUNTER — Ambulatory Visit (HOSPITAL_COMMUNITY): Payer: BC Managed Care – PPO | Admitting: Psychiatry

## 2012-06-13 IMAGING — RF DG BE W/ AIR HIGH DENSITY
18 of 24 series · 18 of 24 positions shown · IV contrast (agent unspecified)
Comparison: CT abdomen pelvis 02/01/2009.

CLINICAL DATA: Abdominal pain and weight loss.

AIR-CONTRAST BARIUM ENEMA
TECHNIQUE: After obtaining a scout radiograph, air-contrast barium
enema was performed under fluoroscopy using high-density barium.
Fluoroscopy Time: 3.0 minutes.
Contrast: Barium.

[Series 1: run · 1 of 1 slices shown (1 of 17)]
[im 1/1]
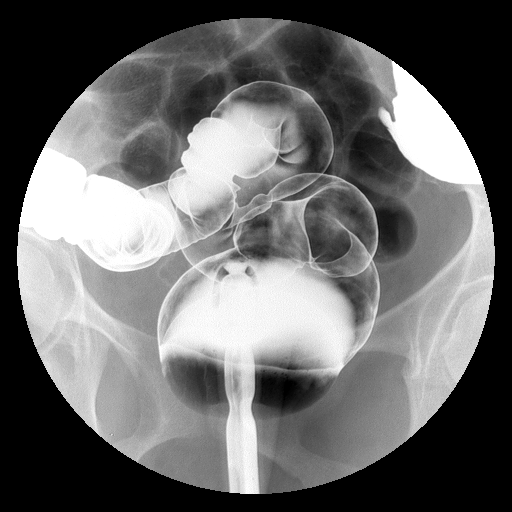

[Series 3: run · 1 of 1 slices shown (2 of 17)]
[im 1/1]
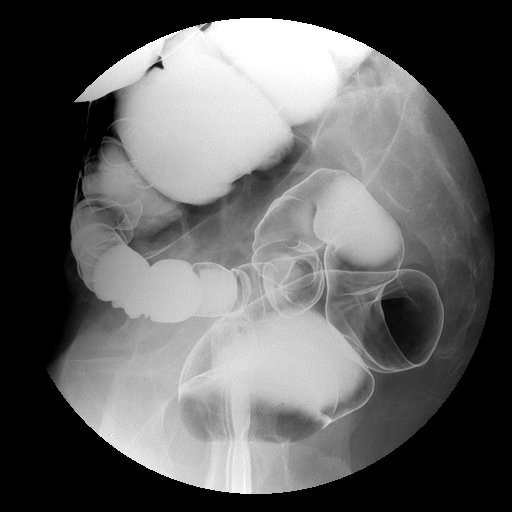

[Series 4: run · 1 of 1 slices shown (3 of 17)]
[im 1/1]
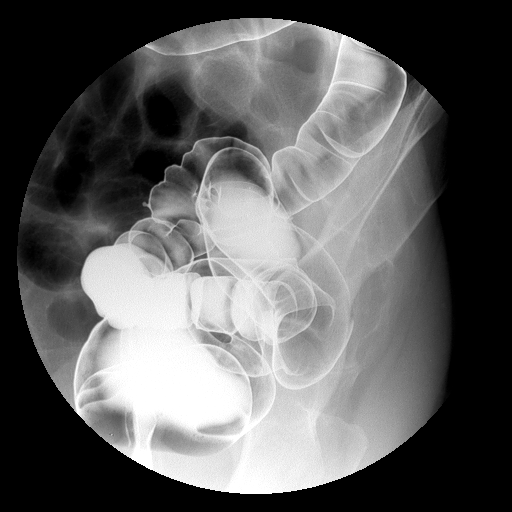

[Series 5: run · 1 of 1 slices shown (4 of 17)]
[im 1/1]
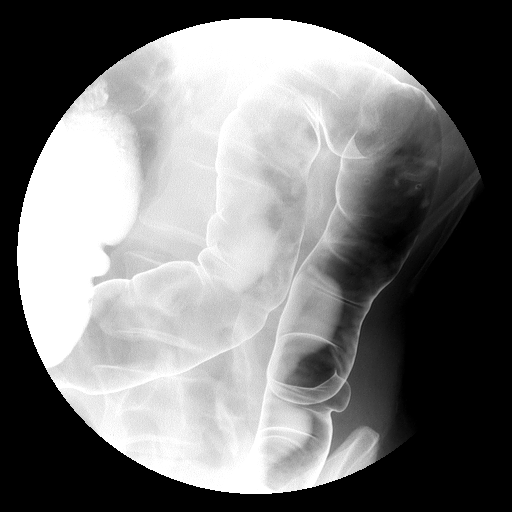

[Series 7: run · 1 of 1 slices shown (5 of 17)]
[im 1/1]
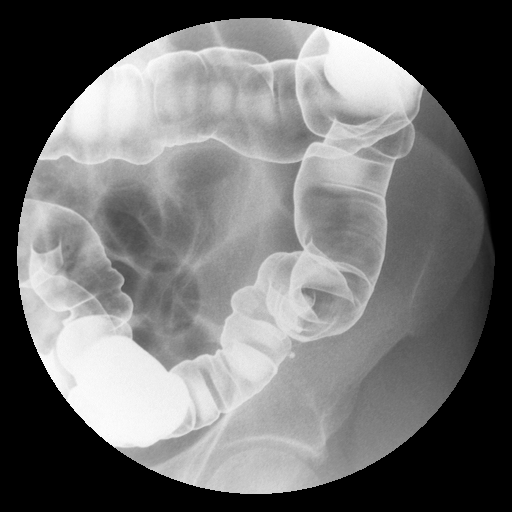

[Series 8: run · 1 of 1 slices shown (6 of 17)]
[im 1/1]
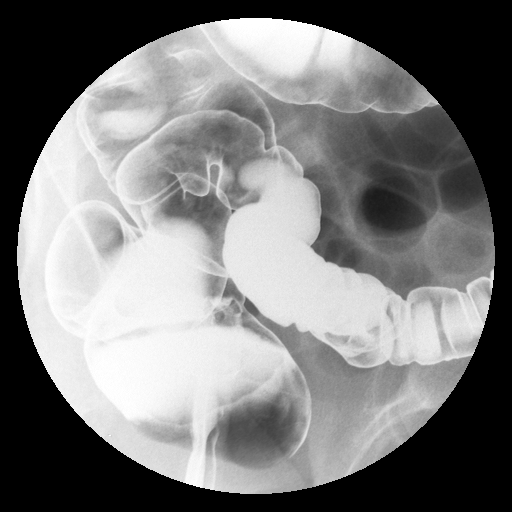

[Series 9: run · 1 of 1 slices shown (7 of 17)]
[im 1/1]
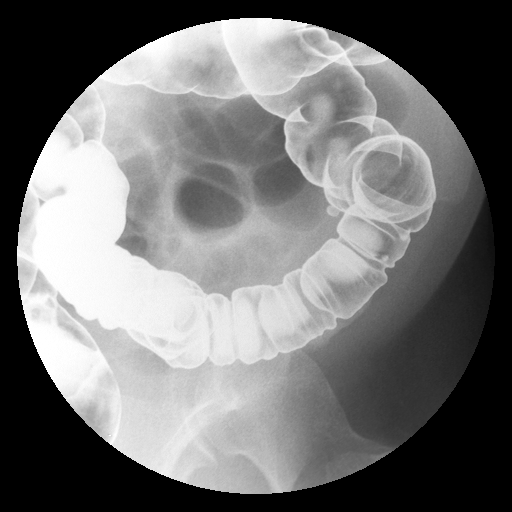

[Series 11: run · 1 of 1 slices shown (8 of 17)]
[im 1/1]
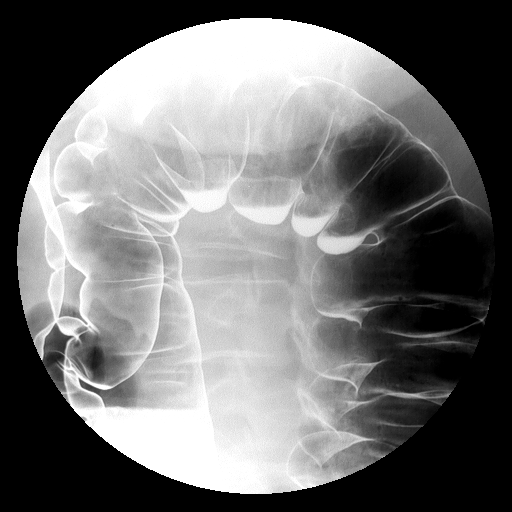

[Series 12: run · 1 of 1 slices shown (9 of 17)]
[im 1/1]
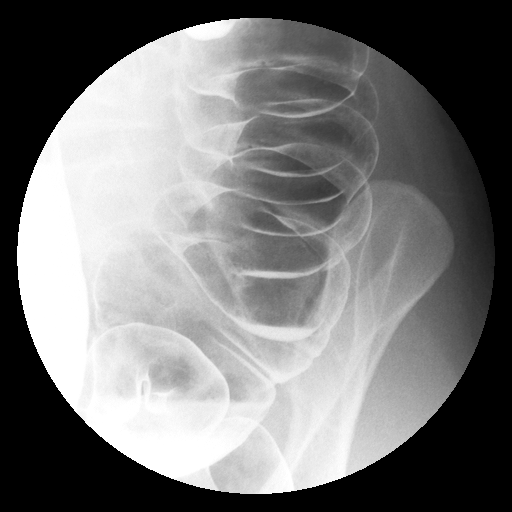

[Series 13: run · 1 of 1 slices shown (10 of 17)]
[im 1/1]
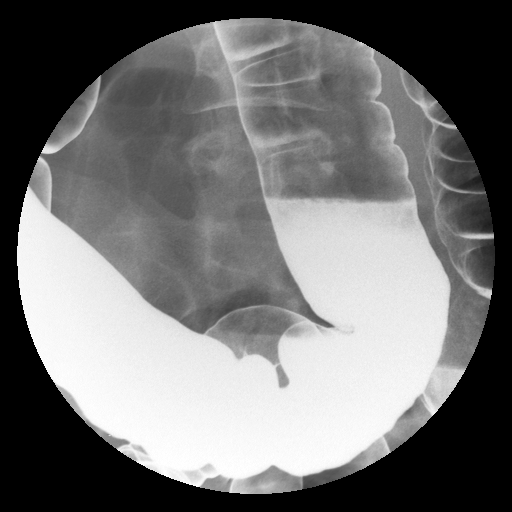

[Series 15: run · 1 of 1 slices shown (11 of 17)]
[im 1/1]
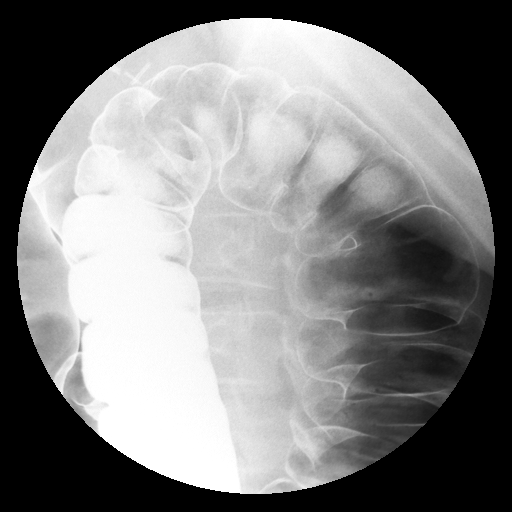

[Series 16: run · 1 of 1 slices shown (12 of 17)]
[im 1/1]
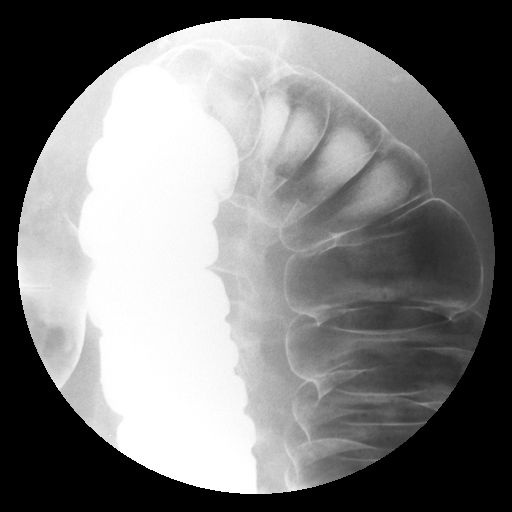

[Series 17: run · 1 of 1 slices shown (13 of 17)]
[im 1/1]
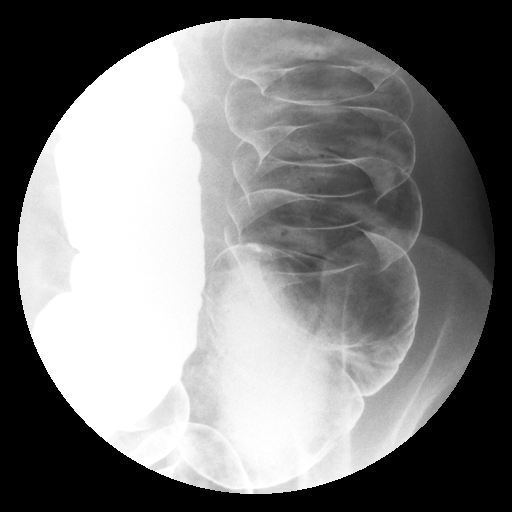

[Series 19: run · 1 of 1 slices shown (14 of 17)]
[im 1/1]
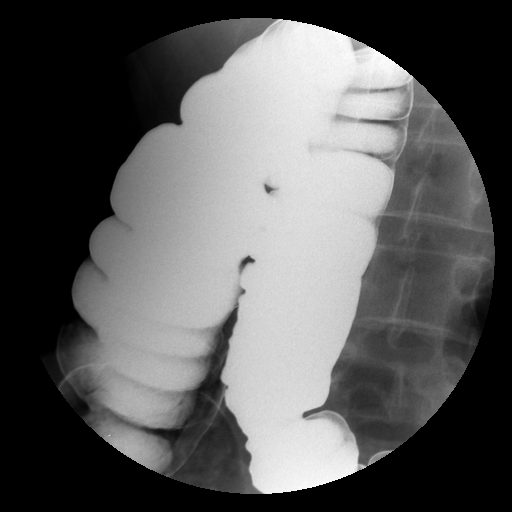

[Series 20: run · 1 of 1 slices shown (15 of 17)]
[im 1/1]
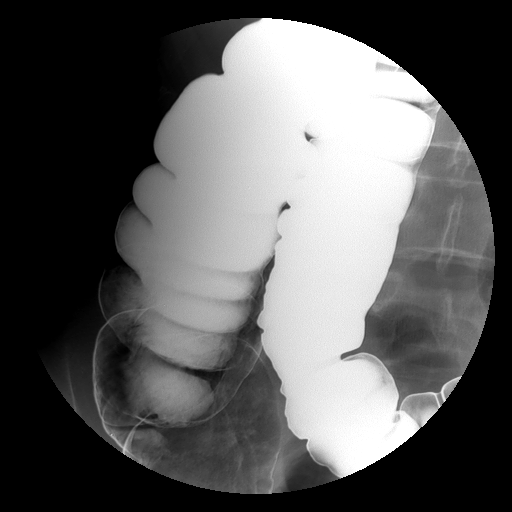

[Series 21: run · 1 of 1 slices shown (16 of 17)]
[im 1/1]
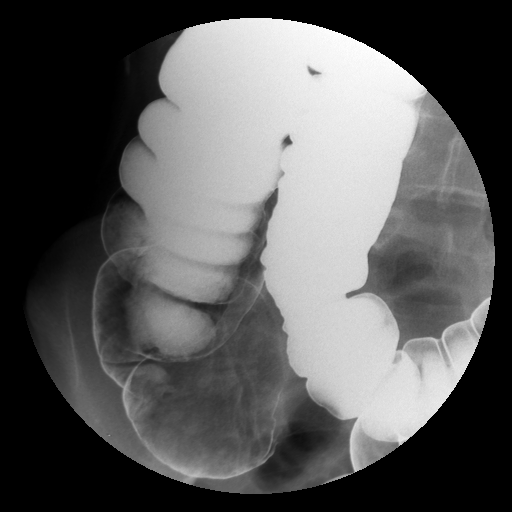

[Series 23: run · 1 of 1 slices shown (17 of 17)]
[im 1/1]
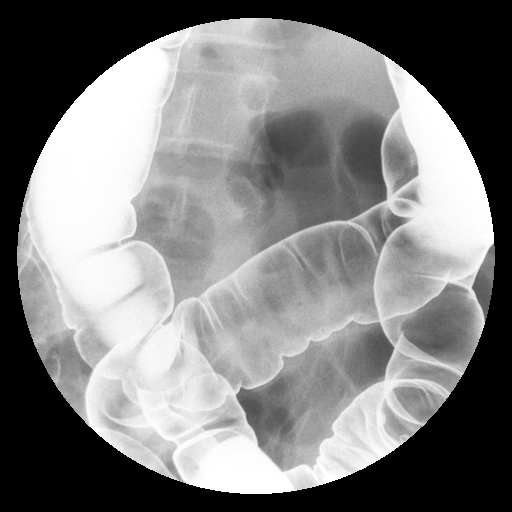

[Series 1001: view not recorded · 0.20mm/px · 1 of 1 slices shown]
[im 1/1]
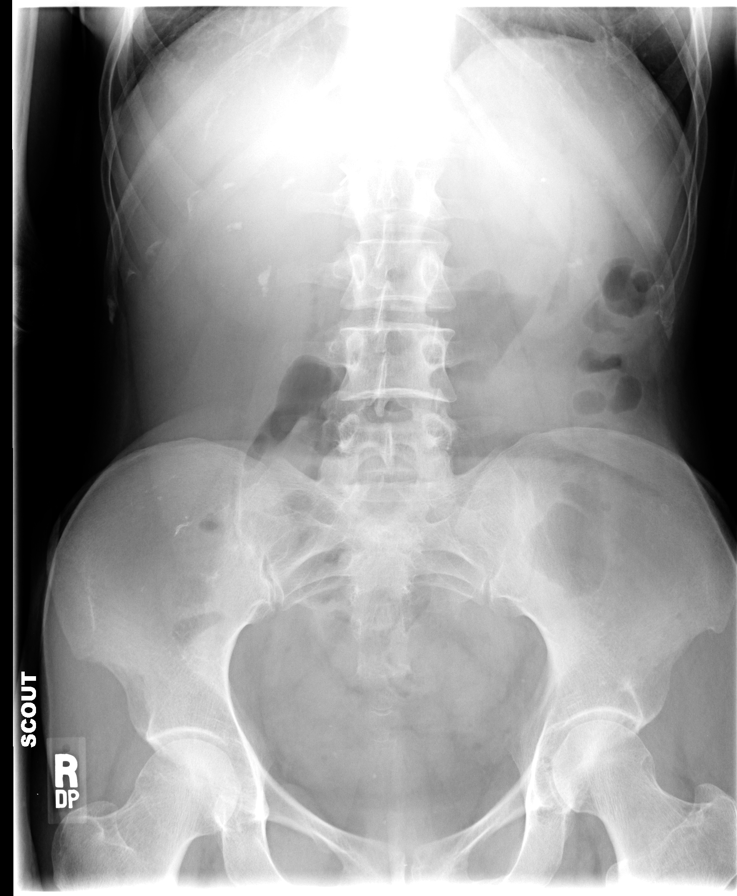

[18 of 24 positions shown; findings below may reference images not displayed]

FINDINGS: Scout view of the abdomen shows a normal bowel gas
pattern.  There are calcifications projecting over the renal
outlines bilaterally.  Cholecystectomy clips in the right upper
quadrant.

Double contrast barium enema shows a few scattered diverticula.  No
polyp, mass or stricture.
IMPRESSION: 1.  Scattered diverticula.  No polyp, mass or stricture.
2.  Bilateral nephrolithiasis.

## 2013-08-26 ENCOUNTER — Emergency Department (HOSPITAL_BASED_OUTPATIENT_CLINIC_OR_DEPARTMENT_OTHER): Payer: BC Managed Care – PPO

## 2013-08-26 ENCOUNTER — Emergency Department (HOSPITAL_BASED_OUTPATIENT_CLINIC_OR_DEPARTMENT_OTHER)
Admission: EM | Admit: 2013-08-26 | Discharge: 2013-08-27 | Disposition: A | Discharge status: Home / Self Care | Payer: BC Managed Care – PPO | Attending: Emergency Medicine | Admitting: Emergency Medicine

## 2013-08-26 ENCOUNTER — Encounter (HOSPITAL_BASED_OUTPATIENT_CLINIC_OR_DEPARTMENT_OTHER): Payer: Self-pay | Admitting: Emergency Medicine

## 2013-08-26 DIAGNOSIS — Z8709 Personal history of other diseases of the respiratory system: Secondary | ICD-10-CM | POA: Insufficient documentation

## 2013-08-26 DIAGNOSIS — H538 Other visual disturbances: Secondary | ICD-10-CM

## 2013-08-26 DIAGNOSIS — E876 Hypokalemia: Secondary | ICD-10-CM | POA: Insufficient documentation

## 2013-08-26 DIAGNOSIS — Z8739 Personal history of other diseases of the musculoskeletal system and connective tissue: Secondary | ICD-10-CM | POA: Insufficient documentation

## 2013-08-26 DIAGNOSIS — Z8719 Personal history of other diseases of the digestive system: Secondary | ICD-10-CM | POA: Insufficient documentation

## 2013-08-26 DIAGNOSIS — R51 Headache: Secondary | ICD-10-CM | POA: Insufficient documentation

## 2013-08-26 DIAGNOSIS — R209 Unspecified disturbances of skin sensation: Secondary | ICD-10-CM | POA: Insufficient documentation

## 2013-08-26 DIAGNOSIS — Z8673 Personal history of transient ischemic attack (TIA), and cerebral infarction without residual deficits: Secondary | ICD-10-CM | POA: Insufficient documentation

## 2013-08-26 DIAGNOSIS — Z8659 Personal history of other mental and behavioral disorders: Secondary | ICD-10-CM | POA: Insufficient documentation

## 2013-08-26 DIAGNOSIS — Z862 Personal history of diseases of the blood and blood-forming organs and certain disorders involving the immune mechanism: Secondary | ICD-10-CM | POA: Insufficient documentation

## 2013-08-26 DIAGNOSIS — I10 Essential (primary) hypertension: Secondary | ICD-10-CM | POA: Insufficient documentation

## 2013-08-26 DIAGNOSIS — Z8541 Personal history of malignant neoplasm of cervix uteri: Secondary | ICD-10-CM | POA: Insufficient documentation

## 2013-08-26 DIAGNOSIS — R0789 Other chest pain: Secondary | ICD-10-CM | POA: Insufficient documentation

## 2013-08-26 DIAGNOSIS — Z79899 Other long term (current) drug therapy: Secondary | ICD-10-CM | POA: Insufficient documentation

## 2013-08-26 DIAGNOSIS — IMO0002 Reserved for concepts with insufficient information to code with codable children: Secondary | ICD-10-CM | POA: Insufficient documentation

## 2013-08-26 DIAGNOSIS — R519 Headache, unspecified: Secondary | ICD-10-CM

## 2013-08-26 HISTORY — DX: Rheumatoid arthritis, unspecified: M06.9

## 2013-08-26 HISTORY — DX: Essential (primary) hypertension: I10

## 2013-08-26 HISTORY — DX: Personal history of transient ischemic attack (TIA), and cerebral infarction without residual deficits: Z86.73

## 2013-08-26 LAB — CBC
HEMATOCRIT: 34.9 % — AB (ref 36.0–46.0)
HEMOGLOBIN: 12.2 g/dL (ref 12.0–15.0)
MCH: 28.4 pg (ref 26.0–34.0)
MCHC: 35 g/dL (ref 30.0–36.0)
MCV: 81.4 fL (ref 78.0–100.0)
Platelets: 203 10*3/uL (ref 150–400)
RBC: 4.29 MIL/uL (ref 3.87–5.11)
RDW: 15.2 % (ref 11.5–15.5)
WBC: 5 10*3/uL (ref 4.0–10.5)

## 2013-08-26 LAB — BASIC METABOLIC PANEL
BUN: 16 mg/dL (ref 6–23)
CALCIUM: 9.3 mg/dL (ref 8.4–10.5)
CO2: 27 mEq/L (ref 19–32)
Chloride: 100 mEq/L (ref 96–112)
Creatinine, Ser: 1.1 mg/dL (ref 0.50–1.10)
GFR, EST AFRICAN AMERICAN: 68 mL/min — AB (ref >90)
GFR, EST NON AFRICAN AMERICAN: 58 mL/min — AB (ref >90)
Glucose, Bld: 107 mg/dL — ABNORMAL HIGH (ref 70–99)
POTASSIUM: 2.6 meq/L — AB (ref 3.7–5.3)
Sodium: 142 mEq/L (ref 137–147)

## 2013-08-26 MED ORDER — METOPROLOL TARTRATE 50 MG PO TABS
ORAL_TABLET | ORAL | Status: AC
  Administered 2013-08-26: 25 mg
  Filled 2013-08-26: qty 1

## 2013-08-26 MED ORDER — POTASSIUM CHLORIDE CRYS ER 20 MEQ PO TBCR
40.0000 meq | EXTENDED_RELEASE_TABLET | Freq: Two times a day (BID) | ORAL | Status: AC
Start: 2013-08-26 — End: ?

## 2013-08-26 MED ORDER — CARVEDILOL 6.25 MG PO TABS
6.2500 mg | ORAL_TABLET | Freq: Once | ORAL | Status: DC
  Filled 2013-08-26: qty 1

## 2013-08-26 MED ORDER — POTASSIUM CHLORIDE CRYS ER 20 MEQ PO TBCR
40.0000 meq | EXTENDED_RELEASE_TABLET | Freq: Once | ORAL | Status: AC
  Administered 2013-08-26: 40 meq via ORAL
  Filled 2013-08-26: qty 2

## 2013-08-26 MED ORDER — AMLODIPINE BESYLATE 5 MG PO TABS
10.0000 mg | ORAL_TABLET | Freq: Once | ORAL | Status: AC
  Administered 2013-08-26: 10 mg via ORAL
  Filled 2013-08-26 (×2): qty 2

## 2013-08-26 NOTE — ED Notes (Signed)
Pt c/o headache started on mon and pt c/o numbness and tingling in left side of face and left hand that started at 4pm today. Pt has hx of TIA

## 2013-08-26 NOTE — Discharge Instructions (Signed)
Visual Disturbances You have had a disturbance in your vision. This may be caused by various conditions, such as:  Migraines. Migraine headaches are often preceded by a disturbance in vision. Blind spots or light flashes are followed by a headache. This type of visual disturbance is temporary. It does not damage the eye.  Glaucoma. This is caused by increased pressure in the eye. Symptoms include haziness, blurred vision, or seeing rainbow colored circles when looking at bright lights. Partial or complete visual loss can occur. You may or may not experience eye pain. Visual loss may be gradual or sudden and is irreversible. Glaucoma is the leading cause of blindness.  Retina problems. Vision will be reduced if the retina becomes detached or if there is a circulation problem as with diabetes, high blood pressure, or a mini-stroke. Symptoms include seeing "floaters," flashes of light, or shadows, as if a curtain has fallen over your eye.  Optic nerve problems. The main nerve in your eye can be damaged by redness, soreness, and swelling (inflammation), poor circulation, drugs, and toxins. It is very important to have a complete exam done by a specialist to determine the exact cause of your eye problem. The specialist may recommend medicines or surgery, depending on the cause of the problem. This can help prevent further loss of vision or reduce the risk of having a stroke. Contact the caregiver to whom you have been referred and arrange for follow-up care right away. SEEK IMMEDIATE MEDICAL CARE IF:   Your vision gets worse.  You develop severe headaches.  You have any weakness or numbness in the face, arms, or legs.  You have any trouble speaking or walking. Document Released: 08/30/2004 Document Revised: 10/15/2011 Document Reviewed: 12/21/2009 [facility] Patient Information 2014 [place].  Migraine Headache A migraine headache is an intense, throbbing pain on one or both sides of your  head. A migraine can last for 30 minutes to several hours. CAUSES  The exact cause of a migraine headache is not always known. However, a migraine may be caused when nerves in the brain become irritated and release chemicals that cause inflammation. This causes pain. Certain things may also trigger migraines, such as:  Alcohol.  Smoking.  Stress.  Menstruation.  Aged cheeses.  Foods or drinks that contain nitrates, glutamate, aspartame, or tyramine.  Lack of sleep.  Chocolate.  Caffeine.  Hunger.  Physical exertion.  Fatigue.  Medicines used to treat chest pain (nitroglycerine), birth control pills, estrogen, and some blood pressure medicines. SIGNS AND SYMPTOMS  Pain on one or both sides of your head.  Pulsating or throbbing pain.  Severe pain that prevents daily activities.  Pain that is aggravated by any physical activity.  Nausea, vomiting, or both.  Dizziness.  Pain with exposure to bright lights, loud noises, or activity.  General sensitivity to bright lights, loud noises, or smells. Before you get a migraine, you may get warning signs that a migraine is coming (aura). An aura may include:  Seeing flashing lights.  Seeing bright spots, halos, or zig-zag lines.  Having tunnel vision or blurred vision.  Having feelings of numbness or tingling.  Having trouble talking.  Having muscle weakness. DIAGNOSIS  A migraine headache is often diagnosed based on:  Symptoms.  Physical exam.  A CT scan or MRI of your head. These imaging tests cannot diagnose migraines, but they can help rule out other causes of headaches. TREATMENT Medicines may be given for pain and nausea. Medicines can also be given to  help prevent recurrent migraines.  HOME CARE INSTRUCTIONS  Only take over-the-counter or prescription medicines for pain or discomfort as directed by your health care provider. The use of long-term narcotics is not recommended.  Lie down in a dark,  quiet room when you have a migraine.  Keep a journal to find out what may trigger your migraine headaches. For example, write down:  What you eat and drink.  How much sleep you get.  Any change to your diet or medicines.  Limit alcohol consumption.  Quit smoking if you smoke.  Get 7 9 hours of sleep, or as recommended by your health care provider.  Limit stress.  Keep lights dim if bright lights bother you and make your migraines worse. SEEK IMMEDIATE MEDICAL CARE IF:   Your migraine becomes severe.  You have a fever.  You have a stiff neck.  You have vision loss.  You have muscular weakness or loss of muscle control.  You start losing your balance or have trouble walking.  You feel faint or pass out.  You have severe symptoms that are different from your first symptoms. MAKE SURE YOU:   Understand these instructions.  Will watch your condition.  Will get help right away if you are not doing well or get worse. Document Released: 07/23/2005 Document Revised: 05/13/2013 Document Reviewed: 03/30/2013 [facility] Patient Information 2014 [place].

## 2013-08-26 NOTE — ED Provider Notes (Signed)
CSN: [id number]     Arrival date & time 08/26/13  2040 History  This chart was scribed for Osvaldo Shipper, MD by Roxan Diesel, ED scribe.  This patient was seen in room MH03/MH03 and the patient's care was started at 9:34 PM.   Chief Complaint  Patient presents with  . Headache  . Numbness    Patient is a 48 y.o. female presenting with headaches. The history is provided by the patient. No language interpreter was used.  Headache Pain severity now: Moderate. Onset quality:  Gradual Duration:  2 days Timing:  Constant Chronicity:  New Associated symptoms: blurred vision, numbness and tingling   Associated symptoms: no diarrhea, no fever, no focal weakness and no vomiting     HPI Comments: Valerie Koch is a 48 y.o. female with h/o TIA and HTN (not medicated for past 2 days) who presents to the Emergency Department complaining of 2 days of headache and blurred vision, with several hours of associated facial numbness.  Pt states that 2 days ago she developed a gradually-worsening headache and blurred vision in both eyes.  These symptoms have been constant since then and today at around 4-5 PM she also developed "tingling" in her face which has been intermittent.  She took 2 325mg  aspirins tonight at 8:30 PM.  She denies facial droop or weakness or numbness in legs.  Pt also states her chest feels "tight" but states this may be due to being under a large amount of stress recently.  She denies CP, fever, vomiting, or diarrhea.  Pt states she does not think her symptoms are due to a TIA.  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  She ran out of her BP medication 2 days ago and is hypertensive at 174/109 on arrival.  She has had "severe hypertension" since her 32s.     Past Medical History  Diagnosis Date  . Hx-TIA (transient ischemic attack)   . Arthritis, rheumatoid   .  Hypertension     Past Surgical History  Procedure Laterality Date  . Abdominal hysterectomy    . Parathyroidectomy      No family history on file.   History  Substance Use Topics  . Smoking status: Never Smoker   . Smokeless tobacco: Not on file  . Alcohol Use: No    OB History   Grav Para Term Preterm Abortions TAB SAB Ect Mult Living                  Review of Systems  Constitutional: Negative for fever.  Eyes: Positive for blurred vision.  Respiratory: Positive for chest tightness.   Cardiovascular: Negative for chest pain.  Gastrointestinal: Negative for vomiting and diarrhea.  Neurological: Positive for numbness and headaches. Negative for focal weakness.  All other systems reviewed and are negative.     Allergies  Morphine and related; Codeine; and Hydrocodone  Home Medications   Current Outpatient Rx  Name  Route  Sig  Dispense  Refill  . amLODipine (NORVASC) 10 MG tablet   Oral   Take 10 mg by mouth daily.         . carvedilol (COREG) 6.25 MG tablet   Oral   Take 6.25 mg by mouth 2 (two) times daily with a meal.         . predniSONE (DELTASONE) 10 MG tablet   Oral  Take 10 mg by mouth daily with breakfast.          BP 174/109  Pulse 90  Temp(Src) 98.3 F (36.8 C) (Oral)  Resp 18  Ht 5\' 6"  (1.676 m)  Wt 115 lb (52.164 kg)  BMI 18.57 kg/m2  SpO2 99%  Physical Exam  Nursing note and vitals reviewed. Constitutional: She is oriented to person, place, and time. She appears well-developed and well-nourished. No distress.  HENT:  Head: Normocephalic and atraumatic.  Eyes: EOM are normal.  Neck: Neck supple. No tracheal deviation present.  Cardiovascular: Normal rate.   Pulmonary/Chest: Effort normal. No respiratory distress.  Musculoskeletal: Normal range of motion.  Neurological: She is alert and oriented to person, place, and time. A cranial nerve deficit (mild light touch altered sensation on left face) is present. She exhibits  normal muscle tone. Coordination normal.  Visual fields normal  Skin: Skin is warm and dry.  Psychiatric: She has a normal mood and affect. Her behavior is normal.    ED Course  Procedures (including critical care time)  DIAGNOSTIC STUDIES: Oxygen Saturation is 99% on room air, normal by my interpretation.    COORDINATION OF CARE: 9:46 PM-Discussed treatment plan which includes head CT, labs, and refill of pt's BP medications with pt at bedside and pt agreed to plan.    Labs Review Labs Reviewed  CBC - Abnormal; Notable for the following:    HCT 34.9 (*)    All other components within normal limits  BASIC METABOLIC PANEL - Abnormal; Notable for the following:    Potassium 2.6 (*)    Glucose, Bld 107 (*)    GFR calc non Af Amer 58 (*)    GFR calc Af Amer 68 (*)    All other components within normal limits    Imaging Review Ct Head Wo Contrast  08/26/2013   CLINICAL DATA:  Headache, numbness  EXAM: CT HEAD WITHOUT CONTRAST  TECHNIQUE: Contiguous axial images were obtained from the base of the skull through the vertex without intravenous contrast.  COMPARISON:  Prior MRI from 12/22/2007  FINDINGS: There is no acute intracranial hemorrhage or infarct. No mass lesion or midline shift. Gray-white matter differentiation is well maintained. Ventricles are normal in size without evidence of hydrocephalus. CSF containing spaces are within normal limits. No extra-axial fluid collection.  The calvarium is intact.  Orbital soft tissues are within normal limits.  The paranasal sinuses and mastoid air cells are well pneumatized and free of fluid.  Scalp soft tissues are unremarkable.  IMPRESSION: No acute intracranial abnormality.   Electronically Signed   By: Jeannine Boga M.D.   On: 08/26/2013 22:10    EKG Interpretation   None      Date: 08/27/2013  Rate: 72  Rhythm: normal sinus rhythm  QRS Axis: normal  Intervals: normal  ST/T Wave abnormalities: normal  Conduction  Disutrbances:none  Narrative Interpretation:   Old EKG Reviewed: unchanged    MDM   1. Blurred vision   2. Headache   3. Hypokalemia    48 year old female presents with headache, blurred vision, facial tingling. She's concerned she is having a TIA. Headache and blurred vision began 2 days ago. Constantly she ran out of her blood pressure meds 2 days ago. She has a history of 3 TIAs previously and treated in our arthritis. She states this feels a little bit like a TIA previously where she had some left facial tingling and some left leg weakness, but has no weakness  at this time. Facial tingling began about 5-6 hours prior to arrival.  Patient believes is likely due to her elevated blood pressure. She stated initially that she does not want to stay in the hospital. I informed her we do not have an MRI and cannot effectively rule out stroke. Patient's exam here shows very mild altered light touch sensation of the left face, otherwise normal. She has good visual fields. She has good coordination normal gait. No weakness in any extremity. He is aware he cannot fully rule out stroke here today. She does not want to be admitted to the hospital. Her labs show hypokalemia, she has a history of the same. I would like to admit her for further workup since she has history of 3 TIAs, however she does not want to stay I informed her she should continue taking aspirin and followup with her regular doctor. She did state that her symptoms are improving afte her blood pressure was a little lower (130s now vs 170s before). I gave her metoprolol, we do not have her home carvedilol here, and Norvasc, which she takes at home. She is seeing her doctor next week. I informed her she should follow up for outpatient workup since she is not willing to stay. She left AMA.    I personally performed the services described in this documentation, which was scribed in my presence. The recorded information has been reviewed and is  accurate.     Osvaldo Shipper, MD 08/27/13 [id number]

## 2013-08-27 ENCOUNTER — Encounter: Payer: Self-pay | Admitting: Critical Care Medicine
